# Patient Record
Sex: Male | Born: 1940 | Race: Black or African American | Hispanic: No | Marital: Single | Smoking: Former smoker
Health system: Southern US, Community
[De-identification: ages and names within clinical notes are randomized; demographics above are authoritative.]

## PROBLEM LIST (undated history)

## (undated) DIAGNOSIS — I10 Essential (primary) hypertension: Secondary | ICD-10-CM

## (undated) DIAGNOSIS — H409 Unspecified glaucoma: Secondary | ICD-10-CM

## (undated) DIAGNOSIS — C3490 Malignant neoplasm of unspecified part of unspecified bronchus or lung: Secondary | ICD-10-CM

## (undated) DIAGNOSIS — E785 Hyperlipidemia, unspecified: Secondary | ICD-10-CM

## (undated) HISTORY — DX: Unspecified glaucoma: H40.9

## (undated) HISTORY — PX: EYE SURGERY: SHX253

## (undated) HISTORY — DX: Essential (primary) hypertension: I10

## (undated) HISTORY — DX: Hyperlipidemia, unspecified: E78.5

---

## 2013-02-24 ENCOUNTER — Encounter (INDEPENDENT_AMBULATORY_CARE_PROVIDER_SITE_OTHER): Payer: PRIVATE HEALTH INSURANCE | Admitting: Ophthalmology

## 2013-02-24 DIAGNOSIS — H2510 Age-related nuclear cataract, unspecified eye: Secondary | ICD-10-CM

## 2013-02-24 DIAGNOSIS — H35039 Hypertensive retinopathy, unspecified eye: Secondary | ICD-10-CM

## 2013-02-24 DIAGNOSIS — H43819 Vitreous degeneration, unspecified eye: Secondary | ICD-10-CM

## 2013-02-24 DIAGNOSIS — H348192 Central retinal vein occlusion, unspecified eye, stable: Secondary | ICD-10-CM

## 2013-02-24 DIAGNOSIS — I10 Essential (primary) hypertension: Secondary | ICD-10-CM

## 2013-03-18 ENCOUNTER — Encounter (INDEPENDENT_AMBULATORY_CARE_PROVIDER_SITE_OTHER): Payer: PRIVATE HEALTH INSURANCE | Admitting: Ophthalmology

## 2013-03-18 DIAGNOSIS — H2510 Age-related nuclear cataract, unspecified eye: Secondary | ICD-10-CM

## 2013-03-18 DIAGNOSIS — I10 Essential (primary) hypertension: Secondary | ICD-10-CM

## 2013-03-18 DIAGNOSIS — H348192 Central retinal vein occlusion, unspecified eye, stable: Secondary | ICD-10-CM

## 2013-03-18 DIAGNOSIS — H35039 Hypertensive retinopathy, unspecified eye: Secondary | ICD-10-CM

## 2013-03-18 DIAGNOSIS — H4310 Vitreous hemorrhage, unspecified eye: Secondary | ICD-10-CM

## 2013-04-20 ENCOUNTER — Encounter (INDEPENDENT_AMBULATORY_CARE_PROVIDER_SITE_OTHER): Payer: PRIVATE HEALTH INSURANCE | Admitting: Ophthalmology

## 2013-04-20 DIAGNOSIS — H35039 Hypertensive retinopathy, unspecified eye: Secondary | ICD-10-CM

## 2013-04-20 DIAGNOSIS — H4310 Vitreous hemorrhage, unspecified eye: Secondary | ICD-10-CM

## 2013-04-20 DIAGNOSIS — I10 Essential (primary) hypertension: Secondary | ICD-10-CM

## 2013-04-20 DIAGNOSIS — H348192 Central retinal vein occlusion, unspecified eye, stable: Secondary | ICD-10-CM

## 2013-08-26 ENCOUNTER — Ambulatory Visit (INDEPENDENT_AMBULATORY_CARE_PROVIDER_SITE_OTHER): Payer: PRIVATE HEALTH INSURANCE | Admitting: Ophthalmology

## 2013-08-26 DIAGNOSIS — H5030 Unspecified intermittent heterotropia: Secondary | ICD-10-CM

## 2013-08-26 DIAGNOSIS — H35039 Hypertensive retinopathy, unspecified eye: Secondary | ICD-10-CM

## 2013-08-26 DIAGNOSIS — H348192 Central retinal vein occlusion, unspecified eye, stable: Secondary | ICD-10-CM

## 2013-08-26 DIAGNOSIS — H43819 Vitreous degeneration, unspecified eye: Secondary | ICD-10-CM

## 2013-08-26 DIAGNOSIS — H2510 Age-related nuclear cataract, unspecified eye: Secondary | ICD-10-CM

## 2013-08-26 DIAGNOSIS — I10 Essential (primary) hypertension: Secondary | ICD-10-CM

## 2013-08-27 ENCOUNTER — Ambulatory Visit: Payer: Self-pay | Admitting: Internal Medicine

## 2014-03-08 ENCOUNTER — Ambulatory Visit (INDEPENDENT_AMBULATORY_CARE_PROVIDER_SITE_OTHER): Payer: PRIVATE HEALTH INSURANCE | Admitting: Ophthalmology

## 2014-03-08 DIAGNOSIS — I10 Essential (primary) hypertension: Secondary | ICD-10-CM

## 2014-03-08 DIAGNOSIS — H43819 Vitreous degeneration, unspecified eye: Secondary | ICD-10-CM

## 2014-03-08 DIAGNOSIS — H348192 Central retinal vein occlusion, unspecified eye, stable: Secondary | ICD-10-CM

## 2014-03-08 DIAGNOSIS — H2510 Age-related nuclear cataract, unspecified eye: Secondary | ICD-10-CM

## 2014-03-08 DIAGNOSIS — H35039 Hypertensive retinopathy, unspecified eye: Secondary | ICD-10-CM

## 2014-06-23 DIAGNOSIS — Z720 Tobacco use: Secondary | ICD-10-CM | POA: Insufficient documentation

## 2014-12-29 DIAGNOSIS — I10 Essential (primary) hypertension: Secondary | ICD-10-CM | POA: Diagnosis not present

## 2014-12-29 DIAGNOSIS — E780 Pure hypercholesterolemia: Secondary | ICD-10-CM | POA: Diagnosis not present

## 2015-03-02 DIAGNOSIS — H43811 Vitreous degeneration, right eye: Secondary | ICD-10-CM | POA: Diagnosis not present

## 2015-03-02 DIAGNOSIS — H4089 Other specified glaucoma: Secondary | ICD-10-CM | POA: Diagnosis not present

## 2015-06-21 DIAGNOSIS — I10 Essential (primary) hypertension: Secondary | ICD-10-CM | POA: Diagnosis not present

## 2015-06-21 DIAGNOSIS — I6529 Occlusion and stenosis of unspecified carotid artery: Secondary | ICD-10-CM | POA: Diagnosis not present

## 2015-06-21 DIAGNOSIS — I6523 Occlusion and stenosis of bilateral carotid arteries: Secondary | ICD-10-CM | POA: Diagnosis not present

## 2015-06-30 DIAGNOSIS — I10 Essential (primary) hypertension: Secondary | ICD-10-CM | POA: Diagnosis not present

## 2015-06-30 DIAGNOSIS — E780 Pure hypercholesterolemia: Secondary | ICD-10-CM | POA: Diagnosis not present

## 2015-07-07 DIAGNOSIS — E780 Pure hypercholesterolemia: Secondary | ICD-10-CM | POA: Diagnosis not present

## 2015-07-07 DIAGNOSIS — E871 Hypo-osmolality and hyponatremia: Secondary | ICD-10-CM | POA: Diagnosis not present

## 2015-07-07 DIAGNOSIS — I10 Essential (primary) hypertension: Secondary | ICD-10-CM | POA: Diagnosis not present

## 2015-07-07 DIAGNOSIS — Z0001 Encounter for general adult medical examination with abnormal findings: Secondary | ICD-10-CM | POA: Diagnosis not present

## 2015-07-07 DIAGNOSIS — Z720 Tobacco use: Secondary | ICD-10-CM | POA: Diagnosis not present

## 2015-07-20 DIAGNOSIS — H43819 Vitreous degeneration, unspecified eye: Secondary | ICD-10-CM | POA: Diagnosis not present

## 2015-07-20 DIAGNOSIS — H43811 Vitreous degeneration, right eye: Secondary | ICD-10-CM | POA: Diagnosis not present

## 2015-07-20 DIAGNOSIS — H4089 Other specified glaucoma: Secondary | ICD-10-CM | POA: Diagnosis not present

## 2015-11-16 DIAGNOSIS — H348111 Central retinal vein occlusion, right eye, with retinal neovascularization: Secondary | ICD-10-CM | POA: Diagnosis not present

## 2015-11-16 DIAGNOSIS — H43813 Vitreous degeneration, bilateral: Secondary | ICD-10-CM | POA: Diagnosis not present

## 2015-11-16 DIAGNOSIS — H4089 Other specified glaucoma: Secondary | ICD-10-CM | POA: Diagnosis not present

## 2015-12-27 DIAGNOSIS — I10 Essential (primary) hypertension: Secondary | ICD-10-CM | POA: Diagnosis not present

## 2015-12-27 DIAGNOSIS — I6523 Occlusion and stenosis of bilateral carotid arteries: Secondary | ICD-10-CM | POA: Diagnosis not present

## 2015-12-27 DIAGNOSIS — I6529 Occlusion and stenosis of unspecified carotid artery: Secondary | ICD-10-CM | POA: Diagnosis not present

## 2015-12-29 DIAGNOSIS — I10 Essential (primary) hypertension: Secondary | ICD-10-CM | POA: Diagnosis not present

## 2016-01-04 DIAGNOSIS — Z125 Encounter for screening for malignant neoplasm of prostate: Secondary | ICD-10-CM | POA: Diagnosis not present

## 2016-01-04 DIAGNOSIS — E7800 Pure hypercholesterolemia, unspecified: Secondary | ICD-10-CM | POA: Diagnosis not present

## 2016-01-04 DIAGNOSIS — E871 Hypo-osmolality and hyponatremia: Secondary | ICD-10-CM | POA: Diagnosis not present

## 2016-01-04 DIAGNOSIS — Z720 Tobacco use: Secondary | ICD-10-CM | POA: Diagnosis not present

## 2016-01-04 DIAGNOSIS — I10 Essential (primary) hypertension: Secondary | ICD-10-CM | POA: Diagnosis not present

## 2016-02-15 DIAGNOSIS — H43819 Vitreous degeneration, unspecified eye: Secondary | ICD-10-CM | POA: Diagnosis not present

## 2016-02-15 DIAGNOSIS — H4089 Other specified glaucoma: Secondary | ICD-10-CM | POA: Diagnosis not present

## 2016-02-15 DIAGNOSIS — H348111 Central retinal vein occlusion, right eye, with retinal neovascularization: Secondary | ICD-10-CM | POA: Diagnosis not present

## 2016-06-26 DIAGNOSIS — I6529 Occlusion and stenosis of unspecified carotid artery: Secondary | ICD-10-CM | POA: Diagnosis not present

## 2016-06-26 DIAGNOSIS — I6523 Occlusion and stenosis of bilateral carotid arteries: Secondary | ICD-10-CM | POA: Diagnosis not present

## 2016-06-26 DIAGNOSIS — I10 Essential (primary) hypertension: Secondary | ICD-10-CM | POA: Diagnosis not present

## 2016-07-04 DIAGNOSIS — Z125 Encounter for screening for malignant neoplasm of prostate: Secondary | ICD-10-CM | POA: Diagnosis not present

## 2016-07-04 DIAGNOSIS — I10 Essential (primary) hypertension: Secondary | ICD-10-CM | POA: Diagnosis not present

## 2016-07-11 DIAGNOSIS — Z720 Tobacco use: Secondary | ICD-10-CM | POA: Diagnosis not present

## 2016-07-11 DIAGNOSIS — Z0001 Encounter for general adult medical examination with abnormal findings: Secondary | ICD-10-CM | POA: Diagnosis not present

## 2016-07-11 DIAGNOSIS — E7800 Pure hypercholesterolemia, unspecified: Secondary | ICD-10-CM | POA: Diagnosis not present

## 2016-07-11 DIAGNOSIS — I10 Essential (primary) hypertension: Secondary | ICD-10-CM | POA: Diagnosis not present

## 2016-09-05 DIAGNOSIS — H348111 Central retinal vein occlusion, right eye, with retinal neovascularization: Secondary | ICD-10-CM | POA: Diagnosis not present

## 2016-09-05 DIAGNOSIS — H4089 Other specified glaucoma: Secondary | ICD-10-CM | POA: Diagnosis not present

## 2016-09-05 DIAGNOSIS — H43819 Vitreous degeneration, unspecified eye: Secondary | ICD-10-CM | POA: Diagnosis not present

## 2017-01-09 DIAGNOSIS — I10 Essential (primary) hypertension: Secondary | ICD-10-CM | POA: Diagnosis not present

## 2017-01-09 DIAGNOSIS — Z125 Encounter for screening for malignant neoplasm of prostate: Secondary | ICD-10-CM | POA: Diagnosis not present

## 2017-01-09 DIAGNOSIS — Z720 Tobacco use: Secondary | ICD-10-CM | POA: Diagnosis not present

## 2017-01-09 DIAGNOSIS — Z0000 Encounter for general adult medical examination without abnormal findings: Secondary | ICD-10-CM | POA: Diagnosis not present

## 2017-02-27 DIAGNOSIS — H4089 Other specified glaucoma: Secondary | ICD-10-CM | POA: Diagnosis not present

## 2017-02-27 DIAGNOSIS — H348111 Central retinal vein occlusion, right eye, with retinal neovascularization: Secondary | ICD-10-CM | POA: Diagnosis not present

## 2017-02-27 DIAGNOSIS — H2512 Age-related nuclear cataract, left eye: Secondary | ICD-10-CM | POA: Diagnosis not present

## 2017-06-12 DIAGNOSIS — H18231 Secondary corneal edema, right eye: Secondary | ICD-10-CM | POA: Diagnosis not present

## 2017-06-14 DIAGNOSIS — T8131XA Disruption of external operation (surgical) wound, not elsewhere classified, initial encounter: Secondary | ICD-10-CM | POA: Diagnosis not present

## 2017-06-14 DIAGNOSIS — S0501XA Injury of conjunctiva and corneal abrasion without foreign body, right eye, initial encounter: Secondary | ICD-10-CM | POA: Diagnosis not present

## 2017-06-14 DIAGNOSIS — H44411 Flat anterior chamber hypotony of right eye: Secondary | ICD-10-CM | POA: Diagnosis not present

## 2017-06-14 DIAGNOSIS — T85698A Other mechanical complication of other specified internal prosthetic devices, implants and grafts, initial encounter: Secondary | ICD-10-CM | POA: Diagnosis not present

## 2017-06-19 DIAGNOSIS — H18231 Secondary corneal edema, right eye: Secondary | ICD-10-CM | POA: Diagnosis not present

## 2017-06-19 DIAGNOSIS — H4089 Other specified glaucoma: Secondary | ICD-10-CM | POA: Diagnosis not present

## 2017-06-19 DIAGNOSIS — H2512 Age-related nuclear cataract, left eye: Secondary | ICD-10-CM | POA: Diagnosis not present

## 2017-06-26 DIAGNOSIS — H4089 Other specified glaucoma: Secondary | ICD-10-CM | POA: Diagnosis not present

## 2017-06-26 DIAGNOSIS — H18231 Secondary corneal edema, right eye: Secondary | ICD-10-CM | POA: Diagnosis not present

## 2017-06-28 ENCOUNTER — Other Ambulatory Visit (INDEPENDENT_AMBULATORY_CARE_PROVIDER_SITE_OTHER): Payer: Self-pay | Admitting: Vascular Surgery

## 2017-06-28 DIAGNOSIS — I6523 Occlusion and stenosis of bilateral carotid arteries: Secondary | ICD-10-CM

## 2017-07-02 ENCOUNTER — Ambulatory Visit (INDEPENDENT_AMBULATORY_CARE_PROVIDER_SITE_OTHER): Payer: Medicare HMO

## 2017-07-02 ENCOUNTER — Encounter (INDEPENDENT_AMBULATORY_CARE_PROVIDER_SITE_OTHER): Payer: Self-pay | Admitting: Vascular Surgery

## 2017-07-02 ENCOUNTER — Ambulatory Visit (INDEPENDENT_AMBULATORY_CARE_PROVIDER_SITE_OTHER): Payer: Medicare HMO | Admitting: Vascular Surgery

## 2017-07-02 VITALS — BP 152/85 | HR 66 | Resp 19 | Ht 68.0 in | Wt 112.0 lb

## 2017-07-02 DIAGNOSIS — I6523 Occlusion and stenosis of bilateral carotid arteries: Secondary | ICD-10-CM | POA: Diagnosis not present

## 2017-07-02 DIAGNOSIS — E785 Hyperlipidemia, unspecified: Secondary | ICD-10-CM | POA: Diagnosis not present

## 2017-07-02 DIAGNOSIS — I6529 Occlusion and stenosis of unspecified carotid artery: Secondary | ICD-10-CM | POA: Insufficient documentation

## 2017-07-02 DIAGNOSIS — I10 Essential (primary) hypertension: Secondary | ICD-10-CM

## 2017-07-02 NOTE — Assessment & Plan Note (Signed)
blood pressure control important in reducing the progression of atherosclerotic disease. On appropriate oral medications.  

## 2017-07-02 NOTE — Assessment & Plan Note (Signed)
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 He has a known right carotid artery occlusion. No worrisome symptoms. Continue aspirin and statin agent. Recheck in 1 year.

## 2017-07-02 NOTE — Progress Notes (Signed)
MRN : [id number]  Cody Williams is a 76 y.o. ([birth year]) male who presents with chief complaint of  Chief Complaint  Patient presents with  . Carotid    1 year carotid f/u  .  History of Present Illness: Patient returns in follow-up of his carotid disease. He is doing well without specific complaints today. He denies focal neurologic symptoms. Specifically, the patient denies amaurosis fugax, speech or swallowing difficulties, or arm or leg weakness or numbness His carotid duplex today demonstrates stable 1-39% left ICA stenosis. He has a known right carotid artery occlusion.  Current Outpatient Prescriptions  Medication Sig Dispense Refill  . amLODipine (NORVASC) 5 MG tablet take 1 tablet by mouth once daily    . aspirin EC 81 MG tablet Take 81 mg by mouth daily.    Marland Kitchen atorvastatin (LIPITOR) 10 MG tablet take 1 tablet by mouth once daily    . ofloxacin (OCUFLOX) 0.3 % ophthalmic solution instill 1 drop into both eyes four times a day  0  . timolol (TIMOPTIC) 0.25 % ophthalmic solution   0   No current facility-administered medications for this visit.     Past Medical History:  Diagnosis Date  . Glaucoma   . Hyperlipidemia   . Hypertension     Past Surgical History:  Procedure Laterality Date  . EYE SURGERY      Social History Social History  Substance Use Topics  . Smoking status: Current Every Day Smoker  . Smokeless tobacco: Never Used  . Alcohol use Yes      Family History No bleeding or clotting disorders  No Known Allergies   REVIEW OF SYSTEMS (Negative unless checked)  Constitutional: [] Weight loss  [] Fever  [] Chills Cardiac: [] Chest pain   [] Chest pressure   [] Palpitations   [] Shortness of breath when laying flat   [] Shortness of breath at rest   [x] Shortness of breath with exertion. Vascular:  [] Pain in legs with walking   [] Pain in legs at rest   [] Pain in legs when laying flat   [] Claudication   [] Pain in feet when walking  [] Pain in feet at  rest  [] Pain in feet when laying flat   [] History of DVT   [] Phlebitis   [] Swelling in legs   [] Varicose veins   [] Non-healing ulcers Pulmonary:   [] Uses home oxygen   [] Productive cough   [] Hemoptysis   [] Wheeze  [] COPD   [] Asthma Neurologic:  [] Dizziness  [] Blackouts   [] Seizures   [] History of stroke   [] History of TIA  [] Aphasia   [] Temporary blindness   [] Dysphagia   [] Weakness or numbness in arms   [] Weakness or numbness in legs Musculoskeletal:  [x] Arthritis   [] Joint swelling   [] Joint pain   [] Low back pain Hematologic:  [] Easy bruising  [] Easy bleeding   [] Hypercoagulable state   [] Anemic  [] Hepatitis Gastrointestinal:  [] Blood in stool   [] Vomiting blood  [] Gastroesophageal reflux/heartburn   [] Difficulty swallowing. Genitourinary:  [] Chronic kidney disease   [] Difficult urination  [] Frequent urination  [] Burning with urination   [] Blood in urine Skin:  [] Rashes   [] Ulcers   [] Wounds Psychological:  [] History of anxiety   []  History of major depression.  Physical Examination  Vitals:   07/02/17 1015 07/02/17 1016  BP: (!) 190/89 (!) 152/85  Pulse: 66   Resp: 19   Weight: 50.8 kg (112 lb)   Height: 5\' 8"  (1.727 m)    Body mass index is 17.03 kg/m. Gen:  WD/WN, NAD Head:  [place]/AT, + temporalis wasting. Ear/Nose/Throat: Hearing grossly intact, nares w/o erythema or drainage, trachea midline Eyes: Conjunctiva clear. Sclera non-icteric Neck: Supple.  No JVD. Right carotid bruit Pulmonary:  Good air movement, equal and clear to auscultation bilaterally.  Cardiac: RRR, normal S1, S2 Vascular:  Vessel Right Left  Radial Palpable Palpable                                    Musculoskeletal: M/S 5/5 throughout.  No deformity or atrophy.  Neurologic: CN 2-12 intact. Sensation grossly intact in extremities.  Symmetrical.  Speech is fluent. Motor exam as listed above. Psychiatric: Judgment intact, Mood & affect appropriate for pt's clinical situation. Dermatologic: No rashes or  ulcers noted.  No cellulitis or open wounds.      CBC No results found for: WBC, HGB, HCT, MCV, PLT  BMET No results found for: NA, K, CL, CO2, GLUCOSE, BUN, CREATININE, CALCIUM, GFRNONAA, GFRAA CrCl cannot be calculated (No order found.).  COAG No results found for: INR, PROTIME  Radiology No results found.    Assessment/Plan Hypertension blood pressure control important in reducing the progression of atherosclerotic disease. On appropriate oral medications.   Hyperlipidemia lipid control important in reducing the progression of atherosclerotic disease. 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 He has a known right carotid artery occlusion. No worrisome symptoms. Continue aspirin and statin agent. Recheck in 1 year.    Leotis Pain, MD  07/02/2017 12:07 PM    This note was created with Dragon medical transcription system.  Any errors from dictation are purely unintentional

## 2017-07-02 NOTE — Assessment & Plan Note (Signed)
lipid control important in reducing the progression of atherosclerotic disease. Continue statin therapy  

## 2017-07-09 DIAGNOSIS — H18231 Secondary corneal edema, right eye: Secondary | ICD-10-CM | POA: Diagnosis not present

## 2017-07-09 DIAGNOSIS — H4089 Other specified glaucoma: Secondary | ICD-10-CM | POA: Diagnosis not present

## 2017-07-10 DIAGNOSIS — Z125 Encounter for screening for malignant neoplasm of prostate: Secondary | ICD-10-CM | POA: Diagnosis not present

## 2017-07-10 DIAGNOSIS — I10 Essential (primary) hypertension: Secondary | ICD-10-CM | POA: Diagnosis not present

## 2017-07-17 DIAGNOSIS — I6523 Occlusion and stenosis of bilateral carotid arteries: Secondary | ICD-10-CM | POA: Diagnosis not present

## 2017-07-17 DIAGNOSIS — Z0001 Encounter for general adult medical examination with abnormal findings: Secondary | ICD-10-CM | POA: Diagnosis not present

## 2017-07-17 DIAGNOSIS — I10 Essential (primary) hypertension: Secondary | ICD-10-CM | POA: Diagnosis not present

## 2017-07-17 DIAGNOSIS — E7800 Pure hypercholesterolemia, unspecified: Secondary | ICD-10-CM | POA: Diagnosis not present

## 2017-07-24 DIAGNOSIS — H18231 Secondary corneal edema, right eye: Secondary | ICD-10-CM | POA: Diagnosis not present

## 2017-07-24 DIAGNOSIS — H4089 Other specified glaucoma: Secondary | ICD-10-CM | POA: Diagnosis not present

## 2017-08-14 DIAGNOSIS — H18231 Secondary corneal edema, right eye: Secondary | ICD-10-CM | POA: Diagnosis not present

## 2017-08-14 DIAGNOSIS — H4089 Other specified glaucoma: Secondary | ICD-10-CM | POA: Diagnosis not present

## 2017-09-27 DIAGNOSIS — H18231 Secondary corneal edema, right eye: Secondary | ICD-10-CM | POA: Diagnosis not present

## 2017-09-27 DIAGNOSIS — H4089 Other specified glaucoma: Secondary | ICD-10-CM | POA: Diagnosis not present

## 2017-11-27 DIAGNOSIS — H4089 Other specified glaucoma: Secondary | ICD-10-CM | POA: Diagnosis not present

## 2017-11-27 DIAGNOSIS — H18231 Secondary corneal edema, right eye: Secondary | ICD-10-CM | POA: Diagnosis not present

## 2018-01-08 DIAGNOSIS — I10 Essential (primary) hypertension: Secondary | ICD-10-CM | POA: Diagnosis not present

## 2018-01-08 DIAGNOSIS — E7800 Pure hypercholesterolemia, unspecified: Secondary | ICD-10-CM | POA: Diagnosis not present

## 2018-01-15 DIAGNOSIS — I6523 Occlusion and stenosis of bilateral carotid arteries: Secondary | ICD-10-CM | POA: Diagnosis not present

## 2018-01-15 DIAGNOSIS — Z720 Tobacco use: Secondary | ICD-10-CM | POA: Diagnosis not present

## 2018-01-15 DIAGNOSIS — Z0000 Encounter for general adult medical examination without abnormal findings: Secondary | ICD-10-CM | POA: Diagnosis not present

## 2018-01-15 DIAGNOSIS — E7800 Pure hypercholesterolemia, unspecified: Secondary | ICD-10-CM | POA: Diagnosis not present

## 2018-01-15 DIAGNOSIS — Z125 Encounter for screening for malignant neoplasm of prostate: Secondary | ICD-10-CM | POA: Diagnosis not present

## 2018-01-15 DIAGNOSIS — I10 Essential (primary) hypertension: Secondary | ICD-10-CM | POA: Diagnosis not present

## 2018-01-23 DIAGNOSIS — H4089 Other specified glaucoma: Secondary | ICD-10-CM | POA: Diagnosis not present

## 2018-01-23 DIAGNOSIS — H18231 Secondary corneal edema, right eye: Secondary | ICD-10-CM | POA: Diagnosis not present

## 2018-05-22 DIAGNOSIS — H18231 Secondary corneal edema, right eye: Secondary | ICD-10-CM | POA: Diagnosis not present

## 2018-05-22 DIAGNOSIS — H4089 Other specified glaucoma: Secondary | ICD-10-CM | POA: Diagnosis not present

## 2018-07-04 ENCOUNTER — Ambulatory Visit (INDEPENDENT_AMBULATORY_CARE_PROVIDER_SITE_OTHER): Payer: Medicare HMO | Admitting: Vascular Surgery

## 2018-07-04 ENCOUNTER — Encounter (INDEPENDENT_AMBULATORY_CARE_PROVIDER_SITE_OTHER): Payer: Self-pay | Admitting: Vascular Surgery

## 2018-07-04 ENCOUNTER — Encounter (INDEPENDENT_AMBULATORY_CARE_PROVIDER_SITE_OTHER): Payer: Self-pay

## 2018-07-04 ENCOUNTER — Ambulatory Visit (INDEPENDENT_AMBULATORY_CARE_PROVIDER_SITE_OTHER): Payer: Medicare HMO

## 2018-07-04 VITALS — BP 182/82 | HR 66 | Resp 16 | Ht 68.0 in | Wt 115.4 lb

## 2018-07-04 DIAGNOSIS — I6523 Occlusion and stenosis of bilateral carotid arteries: Secondary | ICD-10-CM | POA: Diagnosis not present

## 2018-07-04 DIAGNOSIS — I10 Essential (primary) hypertension: Secondary | ICD-10-CM | POA: Diagnosis not present

## 2018-07-04 DIAGNOSIS — E785 Hyperlipidemia, unspecified: Secondary | ICD-10-CM | POA: Diagnosis not present

## 2018-07-04 NOTE — Assessment & Plan Note (Signed)
Carotid duplex today shows his known right ICA occlusion and stable 1 to 39% left ICA stenosis. No role for intervention.  Continue aspirin and statin agent.  Recheck in 1 year

## 2018-07-04 NOTE — Progress Notes (Signed)
MRN : [id number]  Cody Williams is a 77 y.o. (05/29/1941) male who presents with chief complaint of  Chief Complaint  Patient presents with  . Carotid    12yr follow up  .  History of Present Illness: Patient returns in follow-up of his carotid disease.  He is doing well today and has no specific complaints.  He denies focal neurologic symptoms since his last visit. Specifically, the patient denies amaurosis fugax, speech or swallowing difficulties, or arm or leg weakness or numbness Carotid duplex today shows his known right ICA occlusion and stable 1 to 39% left ICA stenosis.  Current Outpatient Medications  Medication Sig Dispense Refill  . amLODipine (NORVASC) 5 MG tablet take 1 tablet by mouth once daily    . aspirin EC 81 MG tablet Take 81 mg by mouth daily.    Marland Kitchen atorvastatin (LIPITOR) 10 MG tablet take 1 tablet by mouth once daily    . ofloxacin (OCUFLOX) 0.3 % ophthalmic solution instill 1 drop into both eyes four times a day  0  . timolol (TIMOPTIC) 0.25 % ophthalmic solution   0   No current facility-administered medications for this visit.     Past Medical History:  Diagnosis Date  . Glaucoma   . Hyperlipidemia   . Hypertension    Past Surgical History Eye surgery  Social History  Substance Use Topics  . Smoking status: Current Every Day Smoker  . Smokeless tobacco: Never Used  . Alcohol use Yes      Family History No bleeding or clotting disorders  No Known Allergies   REVIEW OF SYSTEMS (Negative unless checked)  Constitutional: [] Weight loss  [] Fever  [] Chills Cardiac: [] Chest pain   [] Chest pressure   [] Palpitations   [] Shortness of breath when laying flat   [] Shortness of breath at rest   [x] Shortness of breath with exertion. Vascular:  [] Pain in legs with walking   [] Pain in legs at rest   [] Pain in legs when laying flat   [] Claudication   [] Pain in feet when walking  [] Pain in feet at rest  [] Pain in feet when laying flat   [] History of  DVT   [] Phlebitis   [] Swelling in legs   [] Varicose veins   [] Non-healing ulcers Pulmonary:   [] Uses home oxygen   [] Productive cough   [] Hemoptysis   [] Wheeze  [] COPD   [] Asthma Neurologic:  [] Dizziness  [] Blackouts   [] Seizures   [] History of stroke   [] History of TIA  [] Aphasia   [] Temporary blindness   [] Dysphagia   [] Weakness or numbness in arms   [] Weakness or numbness in legs Musculoskeletal:  [x] Arthritis   [] Joint swelling   [] Joint pain   [] Low back pain Hematologic:  [] Easy bruising  [] Easy bleeding   [] Hypercoagulable state   [] Anemic  [] Hepatitis Gastrointestinal:  [] Blood in stool   [] Vomiting blood  [] Gastroesophageal reflux/heartburn   [] Difficulty swallowing. Genitourinary:  [] Chronic kidney disease   [] Difficult urination  [] Frequent urination  [] Burning with urination   [] Blood in urine Skin:  [] Rashes   [] Ulcers   [] Wounds Psychological:  [] History of anxiety   []  History of major depression.    Physical Examination  Vitals:   07/04/18 1104 07/04/18 1105  BP: (!) 181/85 (!) 182/82  Pulse: 66   Resp: 16   Weight: 115 lb 6.4 oz (52.3 kg)   Height: 5\' 8"  (1.727 m)    Body mass index is 17.55 kg/m. Gen:  WD/WN, NAD Head: [place]/AT, + temporalis  wasting. Ear/Nose/Throat: Hearing grossly intact, nares w/o erythema or drainage, trachea midline Eyes: Conjunctiva clear. Sclera non-icteric Neck: Supple.  Right carotid bruit  Pulmonary:  Good air movement, equal and clear to auscultation bilaterally.  Cardiac: RRR, No JVD Vascular:  Vessel Right Left  Radial Palpable Palpable                                   Musculoskeletal: M/S 5/5 throughout.  No deformity or atrophy.  Neurologic: CN 2-12 intact. Sensation grossly intact in extremities.  Symmetrical.  Speech is fluent. Motor exam as listed above. Psychiatric: Judgment intact, Mood & affect appropriate for pt's clinical situation. Dermatologic: No rashes or ulcers noted.  No cellulitis or open  wounds.      CBC No results found for: WBC, HGB, HCT, MCV, PLT  BMET No results found for: NA, K, CL, CO2, GLUCOSE, BUN, CREATININE, CALCIUM, GFRNONAA, GFRAA CrCl cannot be calculated (No successful lab value found.).  COAG No results found for: INR, PROTIME  Radiology No results found.    Assessment/Plan Hypertension blood pressure control important in reducing the progression of atherosclerotic disease. On appropriate oral medications.   Hyperlipidemia lipid control important in reducing the progression of atherosclerotic disease. Continue statin therapy  Carotid stenosis Carotid duplex today shows his known right ICA occlusion and stable 1 to 39% left ICA stenosis. No role for intervention.  Continue aspirin and statin agent.  Recheck in 1 year    Leotis Pain, MD  07/04/2018 1:15 PM    This note was created with Dragon medical transcription system.  Any errors from dictation are purely unintentional

## 2018-07-16 DIAGNOSIS — Z125 Encounter for screening for malignant neoplasm of prostate: Secondary | ICD-10-CM | POA: Diagnosis not present

## 2018-07-16 DIAGNOSIS — I10 Essential (primary) hypertension: Secondary | ICD-10-CM | POA: Diagnosis not present

## 2018-07-23 DIAGNOSIS — E7800 Pure hypercholesterolemia, unspecified: Secondary | ICD-10-CM | POA: Diagnosis not present

## 2018-07-23 DIAGNOSIS — I6523 Occlusion and stenosis of bilateral carotid arteries: Secondary | ICD-10-CM | POA: Diagnosis not present

## 2018-07-23 DIAGNOSIS — Z0001 Encounter for general adult medical examination with abnormal findings: Secondary | ICD-10-CM | POA: Diagnosis not present

## 2018-07-23 DIAGNOSIS — Z720 Tobacco use: Secondary | ICD-10-CM | POA: Diagnosis not present

## 2018-07-23 DIAGNOSIS — I10 Essential (primary) hypertension: Secondary | ICD-10-CM | POA: Diagnosis not present

## 2018-11-20 DIAGNOSIS — H4089 Other specified glaucoma: Secondary | ICD-10-CM | POA: Diagnosis not present

## 2018-11-20 DIAGNOSIS — H18231 Secondary corneal edema, right eye: Secondary | ICD-10-CM | POA: Diagnosis not present

## 2019-01-14 DIAGNOSIS — E7800 Pure hypercholesterolemia, unspecified: Secondary | ICD-10-CM | POA: Diagnosis not present

## 2019-01-14 DIAGNOSIS — I10 Essential (primary) hypertension: Secondary | ICD-10-CM | POA: Diagnosis not present

## 2019-01-21 DIAGNOSIS — Z720 Tobacco use: Secondary | ICD-10-CM | POA: Diagnosis not present

## 2019-01-21 DIAGNOSIS — E7800 Pure hypercholesterolemia, unspecified: Secondary | ICD-10-CM | POA: Diagnosis not present

## 2019-01-21 DIAGNOSIS — I6523 Occlusion and stenosis of bilateral carotid arteries: Secondary | ICD-10-CM | POA: Diagnosis not present

## 2019-01-21 DIAGNOSIS — I10 Essential (primary) hypertension: Secondary | ICD-10-CM | POA: Diagnosis not present

## 2019-01-21 DIAGNOSIS — Z0000 Encounter for general adult medical examination without abnormal findings: Secondary | ICD-10-CM | POA: Diagnosis not present

## 2019-04-23 DIAGNOSIS — H4089 Other specified glaucoma: Secondary | ICD-10-CM | POA: Diagnosis not present

## 2019-04-23 DIAGNOSIS — H18231 Secondary corneal edema, right eye: Secondary | ICD-10-CM | POA: Diagnosis not present

## 2019-07-22 DIAGNOSIS — I10 Essential (primary) hypertension: Secondary | ICD-10-CM | POA: Diagnosis not present

## 2019-07-29 DIAGNOSIS — I10 Essential (primary) hypertension: Secondary | ICD-10-CM | POA: Diagnosis not present

## 2019-07-29 DIAGNOSIS — Z79899 Other long term (current) drug therapy: Secondary | ICD-10-CM | POA: Diagnosis not present

## 2019-07-29 DIAGNOSIS — I6523 Occlusion and stenosis of bilateral carotid arteries: Secondary | ICD-10-CM | POA: Diagnosis not present

## 2019-07-29 DIAGNOSIS — Z0001 Encounter for general adult medical examination with abnormal findings: Secondary | ICD-10-CM | POA: Diagnosis not present

## 2019-07-29 DIAGNOSIS — Z87891 Personal history of nicotine dependence: Secondary | ICD-10-CM | POA: Diagnosis not present

## 2019-07-29 DIAGNOSIS — E785 Hyperlipidemia, unspecified: Secondary | ICD-10-CM | POA: Diagnosis not present

## 2019-08-04 ENCOUNTER — Ambulatory Visit (INDEPENDENT_AMBULATORY_CARE_PROVIDER_SITE_OTHER): Payer: Medicare HMO

## 2019-08-04 ENCOUNTER — Other Ambulatory Visit: Payer: Self-pay

## 2019-08-04 ENCOUNTER — Encounter (INDEPENDENT_AMBULATORY_CARE_PROVIDER_SITE_OTHER): Payer: Self-pay | Admitting: Vascular Surgery

## 2019-08-04 ENCOUNTER — Ambulatory Visit (INDEPENDENT_AMBULATORY_CARE_PROVIDER_SITE_OTHER): Payer: Medicare HMO | Admitting: Vascular Surgery

## 2019-08-04 VITALS — BP 155/76 | HR 67 | Resp 16 | Wt 129.0 lb

## 2019-08-04 DIAGNOSIS — I10 Essential (primary) hypertension: Secondary | ICD-10-CM | POA: Diagnosis not present

## 2019-08-04 DIAGNOSIS — E785 Hyperlipidemia, unspecified: Secondary | ICD-10-CM | POA: Diagnosis not present

## 2019-08-04 DIAGNOSIS — I6523 Occlusion and stenosis of bilateral carotid arteries: Secondary | ICD-10-CM

## 2019-08-04 NOTE — Progress Notes (Signed)
MRN : [id number]  Cody Williams is a 78 y.o. (1941/03/23) male who presents with chief complaint of  Chief Complaint  Patient presents with  . Follow-up    ultrasound follow up  .  History of Present Illness: Patient returns in follow-up of his carotid disease.  He is doing well.  He has no complaints today.  Carotid duplex today shows a known right ICA occlusion and stable 1 to 39% left ICA stenosis.  Current Outpatient Medications  Medication Sig Dispense Refill  . amLODipine (NORVASC) 5 MG tablet take 1 tablet by mouth once daily    . aspirin EC 81 MG tablet Take 81 mg by mouth daily.    Marland Kitchen atorvastatin (LIPITOR) 10 MG tablet take 1 tablet by mouth once daily    . ofloxacin (OCUFLOX) 0.3 % ophthalmic solution instill 1 drop into both eyes four times a day  0  . timolol (TIMOPTIC) 0.25 % ophthalmic solution   0   No current facility-administered medications for this visit.     Past Medical History:  Diagnosis Date  . Glaucoma   . Hyperlipidemia   . Hypertension     Social History  Substance Use Topics  . Smoking status: Current Every Day Smoker  . Smokeless tobacco: Never Used  . Alcohol use Yes     Family History No bleeding or clotting disorders  No Known Allergies   REVIEW OF SYSTEMS(Negative unless checked)  Constitutional: [] ?Weight loss[] ?Fever[] ?Chills Cardiac:[] ?Chest pain[] ?Chest pressure[] ?Palpitations [] ?Shortness of breath when laying flat [] ?Shortness of breath at rest [x] ?Shortness of breath with exertion. Vascular: [] ?Pain in legs with walking[] ?Pain in legsat rest[] ?Pain in legs when laying flat [] ?Claudication [] ?Pain in feet when walking [] ?Pain in feet at rest [] ?Pain in feet when laying flat [] ?History of DVT [] ?Phlebitis [] ?Swelling in legs [] ?Varicose veins [] ?Non-healing ulcers Pulmonary: [] ?Uses home oxygen [] ?Productive cough[] ?Hemoptysis [] ?Wheeze [] ?COPD [] ?Asthma  Neurologic: [] ?Dizziness [] ?Blackouts [] ?Seizures [] ?History of stroke [] ?History of TIA[] ?Aphasia [] ?Temporary blindness[] ?Dysphagia [] ?Weaknessor numbness in arms [] ?Weakness or numbnessin legs Musculoskeletal: [x] ?Arthritis [] ?Joint swelling [] ?Joint pain [] ?Low back pain Hematologic:[] ?Easy bruising[] ?Easy bleeding [] ?Hypercoagulable state [] ?Anemic [] ?Hepatitis Gastrointestinal:[] ?Blood in stool[] ?Vomiting blood[] ?Gastroesophageal reflux/heartburn[] ?Difficulty swallowing. Genitourinary: [] ?Chronic kidney disease [] ?Difficulturination [] ?Frequenturination [] ?Burning with urination[] ?Blood in urine Skin: [] ?Rashes [] ?Ulcers [] ?Wounds Psychological: [] ?History of anxiety[] ?History of major depression.   Physical Examination  Vitals:   08/04/19 1117  BP: (!) 155/76  Pulse: 67  Resp: 16  Weight: 129 lb (58.5 kg)   Body mass index is 19.61 kg/m. Gen:  WD/WN, NAD Head: [place]/AT, No temporalis wasting. Ear/Nose/Throat: Hearing grossly intact, nares w/o erythema or drainage, trachea midline Eyes: Conjunctiva clear. Sclera non-icteric Neck: Supple.  No bruit  Pulmonary:  Good air movement, equal and clear to auscultation bilaterally.  Cardiac: RRR, No JVD Vascular:  Vessel Right Left  Radial Palpable Palpable               Musculoskeletal: M/S 5/5 throughout.  No deformity or atrophy. No edema. Neurologic: CN 2-12 intact. Sensation grossly intact in extremities.  Symmetrical.  Speech is fluent. Motor exam as listed above. Psychiatric: Judgment intact, Mood & affect appropriate for pt's clinical situation. Dermatologic: No rashes or ulcers noted.  No cellulitis or open wounds.      CBC No results found for: WBC, HGB, HCT, MCV, PLT  BMET No results found for: NA, K, CL, CO2, GLUCOSE, BUN, CREATININE, CALCIUM, GFRNONAA, GFRAA CrCl cannot be calculated (No successful lab value found.).  COAG No results found  for: INR, PROTIME  Radiology No results found.  Assessment/Plan Hypertension blood pressure control important in reducing the progression of atherosclerotic disease. On appropriate oral medications.   Hyperlipidemia lipid control important in reducing the progression of atherosclerotic disease. Continue statin therapy  Carotid stenosis Carotid duplex today shows his known right ICA occlusion and stable 1 to 39% left ICA stenosis. No role for intervention.  Continue aspirin and statin agent.  Recheck in 1 year     Leotis Pain, MD  08/04/2019 11:56 AM    This note was created with Dragon medical transcription system.  Any errors from dictation are purely unintentional

## 2019-08-04 NOTE — Patient Instructions (Signed)
Carotid Artery Disease  The carotid arteries are arteries on both sides of the neck. They carry blood to the brain, face, and neck. Carotid artery disease happens when these arteries become smaller (narrow) or get blocked. If these arteries become smaller or get blocked, you are more likely to have a stroke or a warning stroke (transient ischemic attack). Follow these instructions at home:  Take over-the-counter and prescription medicines only as told by your doctor.  Make sure you understand all instructions about your medicines. Do not stop taking your medicines without talking to your doctor first.  Follow your doctor's diet instructions. It is important to follow a healthy diet. ? Eat foods that include plenty of: ? Fresh fruits. ? Vegetables. ? Lean meats. ? Avoid these foods: ? Foods that are high in fat. ? Foods that are high in salt (sodium). ? Foods that are fried. ? Foods that are processed. ? Foods that have few good nutrients (poor nutritional value).  Keep a healthy weight.  Stay active. Get at least 30 minutes of activity every day.  Do not smoke.  Limit alcohol use to: ? No more than 2 drinks a day for men. ? No more than 1 drink a day for women who are not pregnant.  Do not use illegal drugs.  Keep all follow-up visits as told by your doctor. This is important. Contact a doctor if: Get help right away if:  You have any symptoms of stroke or TIA. The acronym BEFAST is an easy way to remember the main warning signs of stroke. ? B = Balance problems. Signs include dizziness, sudden trouble walking, or loss of balance ? E = Eye problems. This includes trouble seeing or a sudden change in vision. ? F = Face changes. This includes sudden weakness or numbness of the face, or the face or eyelid drooping to one side. ? A = Arm weakness or numbness. This happens suddenly and usually on one side of the body. ? S = Speech problems. This includes trouble speaking or  trouble understanding. ? T = Time. Time to call 911 or seek emergency care. Do not wait to see if symptoms go away. Make note of the time your symptoms started.  Other signs of stroke may include: ? A sudden, severe headache with no known cause. ? Feeling sick to your stomach (nauseous) or throwing up (vomiting). ? Seizure. Call your local emergency services (911 in [place] Do notdrive yourself to the clinic or hospital. Summary  The carotid arteries are arteries on both sides of the neck.  If these arteries get smaller or get blocked, you are more likely to have a stroke or a warning stroke (transient ischemic attack).  Take over-the-counter and prescription medicines only as told by your doctor.  Keep all follow-up visits as told by your doctor. This is important. This information is not intended to replace advice given to you by your health care provider. Make sure you discuss any questions you have with your health care provider. Document Released: 10/01/2012 Document Revised: 10/10/2017 Document Reviewed: 10/10/2017 Elsevier Patient Education  2020 [employer].

## 2019-08-05 DIAGNOSIS — H16401 Unspecified corneal neovascularization, right eye: Secondary | ICD-10-CM | POA: Diagnosis not present

## 2019-08-05 DIAGNOSIS — Z961 Presence of intraocular lens: Secondary | ICD-10-CM | POA: Diagnosis not present

## 2019-08-05 DIAGNOSIS — H25812 Combined forms of age-related cataract, left eye: Secondary | ICD-10-CM | POA: Diagnosis not present

## 2019-08-05 DIAGNOSIS — H179 Unspecified corneal scar and opacity: Secondary | ICD-10-CM | POA: Diagnosis not present

## 2020-01-19 DIAGNOSIS — I10 Essential (primary) hypertension: Secondary | ICD-10-CM | POA: Diagnosis not present

## 2020-01-19 DIAGNOSIS — E7800 Pure hypercholesterolemia, unspecified: Secondary | ICD-10-CM | POA: Diagnosis not present

## 2020-01-22 DIAGNOSIS — Z961 Presence of intraocular lens: Secondary | ICD-10-CM | POA: Diagnosis not present

## 2020-01-22 DIAGNOSIS — H16401 Unspecified corneal neovascularization, right eye: Secondary | ICD-10-CM | POA: Diagnosis not present

## 2020-01-22 DIAGNOSIS — H25812 Combined forms of age-related cataract, left eye: Secondary | ICD-10-CM | POA: Diagnosis not present

## 2020-01-22 DIAGNOSIS — H179 Unspecified corneal scar and opacity: Secondary | ICD-10-CM | POA: Diagnosis not present

## 2020-01-26 DIAGNOSIS — I10 Essential (primary) hypertension: Secondary | ICD-10-CM | POA: Diagnosis not present

## 2020-01-26 DIAGNOSIS — Z79899 Other long term (current) drug therapy: Secondary | ICD-10-CM | POA: Diagnosis not present

## 2020-01-26 DIAGNOSIS — F17210 Nicotine dependence, cigarettes, uncomplicated: Secondary | ICD-10-CM | POA: Diagnosis not present

## 2020-01-26 DIAGNOSIS — E7800 Pure hypercholesterolemia, unspecified: Secondary | ICD-10-CM | POA: Diagnosis not present

## 2020-01-26 DIAGNOSIS — I6523 Occlusion and stenosis of bilateral carotid arteries: Secondary | ICD-10-CM | POA: Diagnosis not present

## 2020-01-26 DIAGNOSIS — R3121 Asymptomatic microscopic hematuria: Secondary | ICD-10-CM | POA: Diagnosis not present

## 2020-01-26 DIAGNOSIS — Z0000 Encounter for general adult medical examination without abnormal findings: Secondary | ICD-10-CM | POA: Diagnosis not present

## 2020-02-04 DIAGNOSIS — H18231 Secondary corneal edema, right eye: Secondary | ICD-10-CM | POA: Diagnosis not present

## 2020-02-04 DIAGNOSIS — H2512 Age-related nuclear cataract, left eye: Secondary | ICD-10-CM | POA: Diagnosis not present

## 2020-02-04 DIAGNOSIS — H40022 Open angle with borderline findings, high risk, left eye: Secondary | ICD-10-CM | POA: Diagnosis not present

## 2020-02-04 DIAGNOSIS — H4089 Other specified glaucoma: Secondary | ICD-10-CM | POA: Diagnosis not present

## 2020-06-09 DIAGNOSIS — H18231 Secondary corneal edema, right eye: Secondary | ICD-10-CM | POA: Diagnosis not present

## 2020-06-09 DIAGNOSIS — H2512 Age-related nuclear cataract, left eye: Secondary | ICD-10-CM | POA: Diagnosis not present

## 2020-06-09 DIAGNOSIS — H4089 Other specified glaucoma: Secondary | ICD-10-CM | POA: Diagnosis not present

## 2020-06-09 DIAGNOSIS — H40022 Open angle with borderline findings, high risk, left eye: Secondary | ICD-10-CM | POA: Diagnosis not present

## 2020-07-26 DIAGNOSIS — E7800 Pure hypercholesterolemia, unspecified: Secondary | ICD-10-CM | POA: Diagnosis not present

## 2020-07-26 DIAGNOSIS — I10 Essential (primary) hypertension: Secondary | ICD-10-CM | POA: Diagnosis not present

## 2020-07-26 DIAGNOSIS — Z125 Encounter for screening for malignant neoplasm of prostate: Secondary | ICD-10-CM | POA: Diagnosis not present

## 2020-08-02 DIAGNOSIS — E7800 Pure hypercholesterolemia, unspecified: Secondary | ICD-10-CM | POA: Diagnosis not present

## 2020-08-02 DIAGNOSIS — I10 Essential (primary) hypertension: Secondary | ICD-10-CM | POA: Diagnosis not present

## 2020-08-02 DIAGNOSIS — I6523 Occlusion and stenosis of bilateral carotid arteries: Secondary | ICD-10-CM | POA: Diagnosis not present

## 2020-08-02 DIAGNOSIS — Z720 Tobacco use: Secondary | ICD-10-CM | POA: Diagnosis not present

## 2020-08-02 DIAGNOSIS — Z0001 Encounter for general adult medical examination with abnormal findings: Secondary | ICD-10-CM | POA: Diagnosis not present

## 2020-08-05 ENCOUNTER — Encounter (INDEPENDENT_AMBULATORY_CARE_PROVIDER_SITE_OTHER): Payer: Self-pay | Admitting: Vascular Surgery

## 2020-08-05 ENCOUNTER — Ambulatory Visit (INDEPENDENT_AMBULATORY_CARE_PROVIDER_SITE_OTHER): Payer: Medicare HMO

## 2020-08-05 ENCOUNTER — Other Ambulatory Visit: Payer: Self-pay

## 2020-08-05 ENCOUNTER — Ambulatory Visit (INDEPENDENT_AMBULATORY_CARE_PROVIDER_SITE_OTHER): Payer: Medicare HMO | Admitting: Vascular Surgery

## 2020-08-05 VITALS — BP 185/81 | HR 67 | Ht 68.0 in | Wt 132.0 lb

## 2020-08-05 DIAGNOSIS — E785 Hyperlipidemia, unspecified: Secondary | ICD-10-CM | POA: Diagnosis not present

## 2020-08-05 DIAGNOSIS — I10 Essential (primary) hypertension: Secondary | ICD-10-CM | POA: Diagnosis not present

## 2020-08-05 DIAGNOSIS — I6523 Occlusion and stenosis of bilateral carotid arteries: Secondary | ICD-10-CM | POA: Diagnosis not present

## 2020-08-05 NOTE — Progress Notes (Signed)
MRN : [id number]  Cody Williams is a 79 y.o. ([birth year]) male who presents with chief complaint of  Chief Complaint  Patient presents with  . Follow-up    1 year carotid U/S   .  History of Present Illness: Patient returns in follow-up of his carotid disease.  He is doing well today without complaints.  He denies any focal neurologic symptoms. Specifically, the patient denies amaurosis fugax, speech or swallowing difficulties, or arm or leg weakness or numbness.  Duplex shows a known occlusion of the right carotid artery with stable 1 to 39% left ICA stenosis.  Current Outpatient Medications  Medication Sig Dispense Refill  . amLODipine (NORVASC) 5 MG tablet take 1 tablet by mouth once daily    . aspirin EC 81 MG tablet Take 81 mg by mouth daily.    Marland Kitchen atorvastatin (LIPITOR) 10 MG tablet take 1 tablet by mouth once daily    . timolol (TIMOPTIC) 0.25 % ophthalmic solution   0  . ofloxacin (OCUFLOX) 0.3 % ophthalmic solution instill 1 drop into both eyes four times a day (Patient not taking: Reported on 08/05/2020)  0   No current facility-administered medications for this visit.    Past Medical History:  Diagnosis Date  . Glaucoma   . Hyperlipidemia   . Hypertension      Social History   Tobacco Use  . Smoking status: Former Smoker    Quit date: 04/15/2018    Years since quitting: 2.3  . Smokeless tobacco: Never Used  Substance Use Topics  . Alcohol use: Yes  . Drug use: No      Family History  Problem Relation Age of Onset  . Varicose Veins Neg Hx   . Vision loss Neg Hx     No Known Allergies   REVIEW OF SYSTEMS (Negative unless checked)  Constitutional: [] Weight loss  [] Fever  [] Chills Cardiac: [] Chest pain   [] Chest pressure   [] Palpitations   [] Shortness of breath when laying flat   [] Shortness of breath at rest   [] Shortness of breath with exertion. Vascular:  [] Pain in legs with walking   [] Pain in legs at rest   [] Pain in legs when laying flat    [] Claudication   [] Pain in feet when walking  [] Pain in feet at rest  [] Pain in feet when laying flat   [] History of DVT   [] Phlebitis   [] Swelling in legs   [] Varicose veins   [] Non-healing ulcers Pulmonary:   [] Uses home oxygen   [] Productive cough   [] Hemoptysis   [] Wheeze  [] COPD   [] Asthma Neurologic:  [] Dizziness  [] Blackouts   [] Seizures   [] History of stroke   [] History of TIA  [] Aphasia   [] Temporary blindness   [] Dysphagia   [] Weakness or numbness in arms   [] Weakness or numbness in legs Musculoskeletal:  [x] Arthritis   [] Joint swelling   [] Joint pain   [] Low back pain Hematologic:  [] Easy bruising  [] Easy bleeding   [] Hypercoagulable state   [] Anemic  [] Hepatitis Gastrointestinal:  [] Blood in stool   [] Vomiting blood  [] Gastroesophageal reflux/heartburn   [] Difficulty swallowing. Genitourinary:  [] Chronic kidney disease   [] Difficult urination  [] Frequent urination  [] Burning with urination   [] Blood in urine Skin:  [] Rashes   [] Ulcers   [] Wounds Psychological:  [] History of anxiety   []  History of major depression.  Physical Examination  Vitals:   08/05/20 1135  BP: (!) 185/81  Pulse: 67  Weight: 132 lb (59.9 kg)  Height: 5\' 8"  (1.727 m)   Body mass index is 20.07 kg/m. Gen:  WD/WN, NAD Head: [place]/AT, + temporalis wasting. Ear/Nose/Throat: Hearing grossly intact, nares w/o erythema or drainage, trachea midline Eyes: Conjunctiva clear. Sclera non-icteric Neck: Supple.  No bruit  Pulmonary:  Good air movement, equal and clear to auscultation bilaterally.  Cardiac: RRR, No JVD Vascular:  Vessel Right Left  Radial Palpable Palpable       Musculoskeletal: M/S 5/5 throughout.  No deformity or atrophy. No edema. Neurologic: CN 2-12 intact. Sensation grossly intact in extremities.  Symmetrical.  Speech is fluent. Motor exam as listed above. Psychiatric: Judgment intact, Mood & affect appropriate for pt's clinical situation. Dermatologic: No rashes or ulcers noted.  No cellulitis  or open wounds.      CBC No results found for: WBC, HGB, HCT, MCV, PLT  BMET No results found for: NA, K, CL, CO2, GLUCOSE, BUN, CREATININE, CALCIUM, GFRNONAA, GFRAA CrCl cannot be calculated (No successful lab value found.).  COAG No results found for: INR, PROTIME  Radiology No results found.   Assessment/Plan Hypertension blood pressure control important in reducing the progression of atherosclerotic disease. On appropriate oral medications.   Hyperlipidemia lipid control important in reducing the progression of atherosclerotic disease. Continue statin therapy  Carotid stenosis Carotid duplex today shows his known right ICA occlusion and stable 1 to 39% left ICA stenosis. No role for intervention. Continue aspirin and statin agent. Recheck in 1 year   Leotis Pain, MD  08/05/2020 12:41 PM    This note was created with Dragon medical transcription system.  Any errors from dictation are purely unintentional

## 2020-08-18 DIAGNOSIS — H18231 Secondary corneal edema, right eye: Secondary | ICD-10-CM | POA: Diagnosis not present

## 2020-08-18 DIAGNOSIS — H40022 Open angle with borderline findings, high risk, left eye: Secondary | ICD-10-CM | POA: Diagnosis not present

## 2020-08-18 DIAGNOSIS — H2512 Age-related nuclear cataract, left eye: Secondary | ICD-10-CM | POA: Diagnosis not present

## 2020-08-18 DIAGNOSIS — H4089 Other specified glaucoma: Secondary | ICD-10-CM | POA: Diagnosis not present

## 2020-08-31 DIAGNOSIS — Z0100 Encounter for examination of eyes and vision without abnormal findings: Secondary | ICD-10-CM | POA: Diagnosis not present

## 2020-08-31 DIAGNOSIS — H524 Presbyopia: Secondary | ICD-10-CM | POA: Diagnosis not present

## 2020-12-22 DIAGNOSIS — H40022 Open angle with borderline findings, high risk, left eye: Secondary | ICD-10-CM | POA: Diagnosis not present

## 2020-12-22 DIAGNOSIS — H4089 Other specified glaucoma: Secondary | ICD-10-CM | POA: Diagnosis not present

## 2020-12-22 DIAGNOSIS — H18231 Secondary corneal edema, right eye: Secondary | ICD-10-CM | POA: Diagnosis not present

## 2020-12-22 DIAGNOSIS — H2512 Age-related nuclear cataract, left eye: Secondary | ICD-10-CM | POA: Diagnosis not present

## 2021-01-24 DIAGNOSIS — I10 Essential (primary) hypertension: Secondary | ICD-10-CM | POA: Diagnosis not present

## 2021-01-31 DIAGNOSIS — I10 Essential (primary) hypertension: Secondary | ICD-10-CM | POA: Diagnosis not present

## 2021-01-31 DIAGNOSIS — E7800 Pure hypercholesterolemia, unspecified: Secondary | ICD-10-CM | POA: Diagnosis not present

## 2021-01-31 DIAGNOSIS — H40003 Preglaucoma, unspecified, bilateral: Secondary | ICD-10-CM | POA: Diagnosis not present

## 2021-01-31 DIAGNOSIS — Z125 Encounter for screening for malignant neoplasm of prostate: Secondary | ICD-10-CM | POA: Diagnosis not present

## 2021-01-31 DIAGNOSIS — I6523 Occlusion and stenosis of bilateral carotid arteries: Secondary | ICD-10-CM | POA: Diagnosis not present

## 2021-01-31 DIAGNOSIS — Z0000 Encounter for general adult medical examination without abnormal findings: Secondary | ICD-10-CM | POA: Diagnosis not present

## 2021-01-31 DIAGNOSIS — R6889 Other general symptoms and signs: Secondary | ICD-10-CM | POA: Insufficient documentation

## 2021-05-25 DIAGNOSIS — H40022 Open angle with borderline findings, high risk, left eye: Secondary | ICD-10-CM | POA: Diagnosis not present

## 2021-05-25 DIAGNOSIS — H4089 Other specified glaucoma: Secondary | ICD-10-CM | POA: Diagnosis not present

## 2021-05-25 DIAGNOSIS — H18231 Secondary corneal edema, right eye: Secondary | ICD-10-CM | POA: Diagnosis not present

## 2021-07-27 DIAGNOSIS — Z125 Encounter for screening for malignant neoplasm of prostate: Secondary | ICD-10-CM | POA: Diagnosis not present

## 2021-07-27 DIAGNOSIS — I10 Essential (primary) hypertension: Secondary | ICD-10-CM | POA: Diagnosis not present

## 2021-08-03 DIAGNOSIS — Z23 Encounter for immunization: Secondary | ICD-10-CM | POA: Diagnosis not present

## 2021-08-03 DIAGNOSIS — H409 Unspecified glaucoma: Secondary | ICD-10-CM | POA: Diagnosis not present

## 2021-08-03 DIAGNOSIS — I10 Essential (primary) hypertension: Secondary | ICD-10-CM | POA: Diagnosis not present

## 2021-08-03 DIAGNOSIS — Z125 Encounter for screening for malignant neoplasm of prostate: Secondary | ICD-10-CM | POA: Diagnosis not present

## 2021-08-03 DIAGNOSIS — E7800 Pure hypercholesterolemia, unspecified: Secondary | ICD-10-CM | POA: Diagnosis not present

## 2021-08-03 DIAGNOSIS — Z0001 Encounter for general adult medical examination with abnormal findings: Secondary | ICD-10-CM | POA: Diagnosis not present

## 2021-08-04 ENCOUNTER — Ambulatory Visit (INDEPENDENT_AMBULATORY_CARE_PROVIDER_SITE_OTHER): Payer: Medicare HMO

## 2021-08-04 ENCOUNTER — Other Ambulatory Visit: Payer: Self-pay

## 2021-08-04 ENCOUNTER — Ambulatory Visit (INDEPENDENT_AMBULATORY_CARE_PROVIDER_SITE_OTHER): Payer: Medicare HMO | Admitting: Nurse Practitioner

## 2021-08-04 VITALS — BP 184/76 | HR 84 | Ht 68.0 in | Wt 125.0 lb

## 2021-08-04 DIAGNOSIS — I10 Essential (primary) hypertension: Secondary | ICD-10-CM

## 2021-08-04 DIAGNOSIS — I6523 Occlusion and stenosis of bilateral carotid arteries: Secondary | ICD-10-CM

## 2021-08-04 DIAGNOSIS — E785 Hyperlipidemia, unspecified: Secondary | ICD-10-CM | POA: Diagnosis not present

## 2021-08-05 ENCOUNTER — Encounter (INDEPENDENT_AMBULATORY_CARE_PROVIDER_SITE_OTHER): Payer: Self-pay | Admitting: Nurse Practitioner

## 2021-08-05 NOTE — Progress Notes (Signed)
Subjective:    Patient ID: Cody Williams, male    DOB: Sep 01, 1941, 80 y.o.   MRN: [id number] Chief Complaint  Patient presents with   Follow-up    89yr Carotid    [name] is a 80 year old male that presents today for follow-up in regards to his carotid artery stenosis.The carotid stenosis followed by ultrasound.   The patient denies amaurosis fugax. There is no recent history of TIA symptoms or focal motor deficits. There is no prior documented CVA.  The patient is taking enteric-coated aspirin 81 mg daily.  There is no history of migraine headaches. There is no history of seizures.  The patient has a history of coronary artery disease, no recent episodes of angina or shortness of breath. The patient denies PAD or claudication symptoms. There is a history of hyperlipidemia which is being treated with a statin.    Carotid Duplex done today shows a known occluded right ICA with 1 to 39% stenosis of the left ICA.Marland Kitchen  No change compared to last study in 08/05/2020   Review of Systems  All other systems reviewed and are negative.     Objective:   Physical Exam Vitals reviewed.  HENT:     Head: Normocephalic.  Neck:     Vascular: Carotid bruit present.  Cardiovascular:     Rate and Rhythm: Normal rate.  Pulmonary:     Effort: Pulmonary effort is normal.  Skin:    General: Skin is warm and dry.  Neurological:     Mental Status: He is alert and oriented to person, place, and time.  Psychiatric:        Mood and Affect: Mood normal.        Behavior: Behavior normal.        Thought Content: Thought content normal.        Judgment: Judgment normal.    BP (!) 184/76   Pulse 84   Ht 5\' 8"  (1.727 m)   Wt 125 lb (56.7 kg)   BMI 19.01 kg/m   Past Medical History:  Diagnosis Date   Glaucoma    Hyperlipidemia    Hypertension     Social History   Socioeconomic History   Marital status: Unknown    Spouse name: Not on file   Number of children: Not on file    Years of education: Not on file   Highest education level: Not on file  Occupational History   Not on file  Tobacco Use   Smoking status: Former    Types: Cigarettes    Quit date: 04/15/2018    Years since quitting: 3.3   Smokeless tobacco: Never  Substance and Sexual Activity   Alcohol use: Yes   Drug use: No   Sexual activity: Not on file  Other Topics Concern   Not on file  Social History Narrative   Not on file   Social Determinants of Health   Financial Resource Strain: Not on file  Food Insecurity: Not on file  Transportation Needs: Not on file  Physical Activity: Not on file  Stress: Not on file  Social Connections: Not on file  Intimate Partner Violence: Not on file    Past Surgical History:  Procedure Laterality Date   EYE SURGERY      Family History  Problem Relation Age of Onset   Varicose Veins Neg Hx    Vision loss Neg Hx     No Known Allergies  No flowsheet data found.  CMP  No results found for: NA, K, CL, CO2, GLUCOSE, BUN, CREATININE, CALCIUM, PROT, ALBUMIN, AST, ALT, ALKPHOS, BILITOT, GFRNONAA, GFRAA   No results found.     Assessment & Plan:   1. Bilateral carotid artery stenosis Recommend:  Given the patient's asymptomatic subcritical stenosis no further invasive testing or surgery at this time.  Duplex ultrasound shows an occluded right ICA (previously known) with a 1 to 39% stenosis of the left ICA.  Continue antiplatelet therapy as prescribed Continue management of CAD, HTN and Hyperlipidemia Healthy heart diet,  encouraged exercise at least 4 times per week Follow up in 12 months with duplex ultrasound and physical exam    2. Primary hypertension The patient's blood pressure is elevated today.  He notes that he may have forgotten his medicine today.  Patient is advised to take his medicine as soon as he arrives home and to continue taking it daily.  3. Hyperlipidemia, unspecified hyperlipidemia type Continue statin as  ordered and reviewed, no changes at this time    Current Outpatient Medications on File Prior to Visit  Medication Sig Dispense Refill   amLODipine (NORVASC) 5 MG tablet take 1 tablet by mouth once daily     aspirin EC 81 MG tablet Take 81 mg by mouth daily.     atorvastatin (LIPITOR) 10 MG tablet take 1 tablet by mouth once daily     ofloxacin (OCUFLOX) 0.3 % ophthalmic solution instill 1 drop into both eyes four times a day  0   timolol (TIMOPTIC) 0.25 % ophthalmic solution   0   No current facility-administered medications on file prior to visit.    There are no Patient Instructions on file for this visit. No follow-ups on file.   Cody Hartmann, NP

## 2021-08-30 ENCOUNTER — Encounter: Payer: Self-pay | Admitting: Emergency Medicine

## 2021-08-30 ENCOUNTER — Other Ambulatory Visit: Payer: Self-pay

## 2021-08-30 ENCOUNTER — Emergency Department: Payer: Medicare HMO

## 2021-08-30 ENCOUNTER — Observation Stay: Payer: Medicare HMO

## 2021-08-30 ENCOUNTER — Inpatient Hospital Stay
Admission: EM | Admit: 2021-08-30 | Discharge: 2021-09-03 | DRG: 180 | Disposition: A | Discharge status: Home Health | Payer: Medicare HMO | Attending: Internal Medicine | Admitting: Internal Medicine

## 2021-08-30 DIAGNOSIS — J849 Interstitial pulmonary disease, unspecified: Secondary | ICD-10-CM | POA: Diagnosis present

## 2021-08-30 DIAGNOSIS — J910 Malignant pleural effusion: Secondary | ICD-10-CM | POA: Diagnosis present

## 2021-08-30 DIAGNOSIS — F17210 Nicotine dependence, cigarettes, uncomplicated: Secondary | ICD-10-CM | POA: Diagnosis present

## 2021-08-30 DIAGNOSIS — R599 Enlarged lymph nodes, unspecified: Secondary | ICD-10-CM | POA: Diagnosis not present

## 2021-08-30 DIAGNOSIS — H409 Unspecified glaucoma: Secondary | ICD-10-CM | POA: Diagnosis present

## 2021-08-30 DIAGNOSIS — E43 Unspecified severe protein-calorie malnutrition: Secondary | ICD-10-CM | POA: Diagnosis present

## 2021-08-30 DIAGNOSIS — Z20822 Contact with and (suspected) exposure to covid-19: Secondary | ICD-10-CM | POA: Diagnosis present

## 2021-08-30 DIAGNOSIS — Z720 Tobacco use: Secondary | ICD-10-CM | POA: Diagnosis present

## 2021-08-30 DIAGNOSIS — I10 Essential (primary) hypertension: Secondary | ICD-10-CM | POA: Diagnosis present

## 2021-08-30 DIAGNOSIS — Z515 Encounter for palliative care: Secondary | ICD-10-CM | POA: Diagnosis not present

## 2021-08-30 DIAGNOSIS — R6521 Severe sepsis with septic shock: Secondary | ICD-10-CM | POA: Diagnosis present

## 2021-08-30 DIAGNOSIS — I700 Atherosclerosis of aorta: Secondary | ICD-10-CM | POA: Diagnosis not present

## 2021-08-30 DIAGNOSIS — R6510 Systemic inflammatory response syndrome (SIRS) of non-infectious origin without acute organ dysfunction: Secondary | ICD-10-CM | POA: Diagnosis present

## 2021-08-30 DIAGNOSIS — J90 Pleural effusion, not elsewhere classified: Secondary | ICD-10-CM | POA: Diagnosis present

## 2021-08-30 DIAGNOSIS — R5381 Other malaise: Secondary | ICD-10-CM | POA: Diagnosis present

## 2021-08-30 DIAGNOSIS — R918 Other nonspecific abnormal finding of lung field: Secondary | ICD-10-CM | POA: Diagnosis not present

## 2021-08-30 DIAGNOSIS — J9601 Acute respiratory failure with hypoxia: Secondary | ICD-10-CM | POA: Diagnosis present

## 2021-08-30 DIAGNOSIS — C782 Secondary malignant neoplasm of pleura: Secondary | ICD-10-CM | POA: Diagnosis present

## 2021-08-30 DIAGNOSIS — J439 Emphysema, unspecified: Secondary | ICD-10-CM | POA: Diagnosis present

## 2021-08-30 DIAGNOSIS — H5461 Unqualified visual loss, right eye, normal vision left eye: Secondary | ICD-10-CM | POA: Diagnosis present

## 2021-08-30 DIAGNOSIS — K5730 Diverticulosis of large intestine without perforation or abscess without bleeding: Secondary | ICD-10-CM | POA: Diagnosis not present

## 2021-08-30 DIAGNOSIS — Z681 Body mass index (BMI) 19 or less, adult: Secondary | ICD-10-CM | POA: Diagnosis not present

## 2021-08-30 DIAGNOSIS — Z79899 Other long term (current) drug therapy: Secondary | ICD-10-CM | POA: Diagnosis not present

## 2021-08-30 DIAGNOSIS — E785 Hyperlipidemia, unspecified: Secondary | ICD-10-CM | POA: Diagnosis present

## 2021-08-30 DIAGNOSIS — D72829 Elevated white blood cell count, unspecified: Secondary | ICD-10-CM | POA: Diagnosis present

## 2021-08-30 DIAGNOSIS — M47816 Spondylosis without myelopathy or radiculopathy, lumbar region: Secondary | ICD-10-CM | POA: Diagnosis not present

## 2021-08-30 DIAGNOSIS — R531 Weakness: Secondary | ICD-10-CM | POA: Diagnosis not present

## 2021-08-30 DIAGNOSIS — R0602 Shortness of breath: Secondary | ICD-10-CM | POA: Diagnosis not present

## 2021-08-30 DIAGNOSIS — A419 Sepsis, unspecified organism: Secondary | ICD-10-CM | POA: Diagnosis present

## 2021-08-30 DIAGNOSIS — Z7982 Long term (current) use of aspirin: Secondary | ICD-10-CM

## 2021-08-30 DIAGNOSIS — I6529 Occlusion and stenosis of unspecified carotid artery: Secondary | ICD-10-CM | POA: Diagnosis present

## 2021-08-30 DIAGNOSIS — Z48813 Encounter for surgical aftercare following surgery on the respiratory system: Secondary | ICD-10-CM | POA: Diagnosis not present

## 2021-08-30 DIAGNOSIS — I619 Nontraumatic intracerebral hemorrhage, unspecified: Secondary | ICD-10-CM | POA: Diagnosis not present

## 2021-08-30 DIAGNOSIS — C801 Malignant (primary) neoplasm, unspecified: Secondary | ICD-10-CM | POA: Diagnosis not present

## 2021-08-30 LAB — TROPONIN I (HIGH SENSITIVITY)
Troponin I (High Sensitivity): 24 ng/L — ABNORMAL HIGH (ref <18)
Troponin I (High Sensitivity): 64 ng/L — ABNORMAL HIGH (ref <18)

## 2021-08-30 LAB — CBC
HCT: 41.1 % (ref 39.0–52.0)
Hemoglobin: 13.3 g/dL (ref 13.0–17.0)
MCH: 28.2 pg (ref 26.0–34.0)
MCHC: 32.4 g/dL (ref 30.0–36.0)
MCV: 87.1 fL (ref 80.0–100.0)
Platelets: 713 10*3/uL — ABNORMAL HIGH (ref 150–400)
RBC: 4.72 MIL/uL (ref 4.22–5.81)
RDW: 13.6 % (ref 11.5–15.5)
WBC: 18.2 10*3/uL — ABNORMAL HIGH (ref 4.0–10.5)
nRBC: 0 % (ref 0.0–0.2)

## 2021-08-30 LAB — URINALYSIS, ROUTINE W REFLEX MICROSCOPIC
Bacteria, UA: NONE SEEN
Bilirubin Urine: NEGATIVE
Glucose, UA: NEGATIVE mg/dL
Hgb urine dipstick: NEGATIVE
Ketones, ur: 5 mg/dL — AB
Leukocytes,Ua: NEGATIVE
Nitrite: NEGATIVE
Protein, ur: 30 mg/dL — AB
Specific Gravity, Urine: 1.028 (ref 1.005–1.030)
pH: 5 (ref 5.0–8.0)

## 2021-08-30 LAB — BRAIN NATRIURETIC PEPTIDE: B Natriuretic Peptide: 85 pg/mL (ref 0.0–100.0)

## 2021-08-30 LAB — BASIC METABOLIC PANEL
Anion gap: 11 (ref 5–15)
BUN: 35 mg/dL — ABNORMAL HIGH (ref 8–23)
CO2: 30 mmol/L (ref 22–32)
Calcium: 9.5 mg/dL (ref 8.9–10.3)
Chloride: 100 mmol/L (ref 98–111)
Creatinine, Ser: 0.89 mg/dL (ref 0.61–1.24)
GFR, Estimated: >60 mL/min (ref >60)
Glucose, Bld: 123 mg/dL — ABNORMAL HIGH (ref 70–99)
Potassium: 4.6 mmol/L (ref 3.5–5.1)
Sodium: 141 mmol/L (ref 135–145)

## 2021-08-30 LAB — PROCALCITONIN: Procalcitonin: 0.1 ng/mL

## 2021-08-30 LAB — RESP PANEL BY RT-PCR (FLU A&B, COVID) ARPGX2
Influenza A by PCR: NEGATIVE
Influenza B by PCR: NEGATIVE
SARS Coronavirus 2 by RT PCR: NEGATIVE

## 2021-08-30 MED ORDER — SODIUM CHLORIDE 0.9 % IV SOLN
1.0000 g | Freq: Once | INTRAVENOUS | Status: AC
  Administered 2021-08-30: 1 g via INTRAVENOUS
  Filled 2021-08-30: qty 10

## 2021-08-30 MED ORDER — ONDANSETRON HCL 4 MG/2ML IJ SOLN: 4.0000 mg | Freq: Four times a day (QID) | INTRAMUSCULAR | Status: DC | PRN

## 2021-08-30 MED ORDER — SODIUM CHLORIDE 0.9 % IV SOLN
500.0000 mg | INTRAVENOUS | Status: AC
  Administered 2021-08-31 – 2021-09-01 (×2): 500 mg via INTRAVENOUS
  Filled 2021-08-30 (×2): qty 500

## 2021-08-30 MED ORDER — SODIUM CHLORIDE 0.9 % IV SOLN
1.0000 g | Freq: Once | INTRAVENOUS | Status: DC
  Administered 2021-08-30: 1 g via INTRAVENOUS
  Filled 2021-08-30: qty 10

## 2021-08-30 MED ORDER — AMLODIPINE BESYLATE 5 MG PO TABS
5.0000 mg | ORAL_TABLET | Freq: Every day | ORAL | Status: DC
  Administered 2021-08-31 – 2021-09-03 (×4): 5 mg via ORAL
  Filled 2021-08-30 (×4): qty 1

## 2021-08-30 MED ORDER — ATORVASTATIN CALCIUM 20 MG PO TABS
10.0000 mg | ORAL_TABLET | Freq: Every day | ORAL | Status: DC
  Administered 2021-08-30 – 2021-09-02 (×4): 10 mg via ORAL
  Filled 2021-08-30 (×4): qty 1

## 2021-08-30 MED ORDER — HEPARIN SODIUM (PORCINE) 5000 UNIT/ML IJ SOLN
5000.0000 [IU] | Freq: Three times a day (TID) | INTRAMUSCULAR | Status: DC
  Administered 2021-08-31 – 2021-09-03 (×10): 5000 [IU] via SUBCUTANEOUS
  Filled 2021-08-30 (×10): qty 1

## 2021-08-30 MED ORDER — ACETAMINOPHEN 650 MG RE SUPP: 650.0000 mg | Freq: Four times a day (QID) | RECTAL | Status: AC | PRN

## 2021-08-30 MED ORDER — DORZOLAMIDE HCL-TIMOLOL MAL 2-0.5 % OP SOLN
1.0000 [drp] | Freq: Two times a day (BID) | OPHTHALMIC | Status: DC
  Administered 2021-08-31 – 2021-09-03 (×7): 1 [drp] via OPHTHALMIC
  Filled 2021-08-30 (×3): qty 10

## 2021-08-30 MED ORDER — SODIUM CHLORIDE 0.9 % IV SOLN
500.0000 mg | Freq: Once | INTRAVENOUS | Status: AC
  Administered 2021-08-30: 500 mg via INTRAVENOUS
  Filled 2021-08-30: qty 500

## 2021-08-30 MED ORDER — SODIUM CHLORIDE 0.9 % IV SOLN: 2.0000 g | INTRAVENOUS | Status: DC

## 2021-08-30 MED ORDER — ACETAMINOPHEN 325 MG PO TABS: 650.0000 mg | ORAL_TABLET | Freq: Four times a day (QID) | ORAL | Status: AC | PRN

## 2021-08-30 MED ORDER — SODIUM CHLORIDE 0.9 % IV SOLN
2.0000 g | INTRAVENOUS | Status: DC
  Administered 2021-08-31 – 2021-09-02 (×3): 2 g via INTRAVENOUS
  Filled 2021-08-30 (×4): qty 20

## 2021-08-30 MED ORDER — HEPARIN SODIUM (PORCINE) 5000 UNIT/ML IJ SOLN: 5000.0000 [IU] | Freq: Three times a day (TID) | INTRAMUSCULAR | Status: DC

## 2021-08-30 MED ORDER — ONDANSETRON HCL 4 MG PO TABS: 4.0000 mg | ORAL_TABLET | Freq: Four times a day (QID) | ORAL | Status: DC | PRN

## 2021-08-30 MED ORDER — NICOTINE 21 MG/24HR TD PT24: 21.0000 mg | MEDICATED_PATCH | Freq: Every day | TRANSDERMAL | Status: DC | PRN

## 2021-08-30 NOTE — H&P (Addendum)
History and Physical   Cody Williams NLZ:[id number] DOB: 10/08/1941 DOA: 08/30/2021  PCP: Baxter Hire, MD  Outpatient Specialists: Dr. Lucky Cowboy, vascular Patient coming from: home via [place]  I have personally briefly reviewed patient's old medical records in [place].  Chief Concern: Shortness of breath and cough  HPI: Cody Williams is a 80 y.o. male with medical history significant for tobacco use, hypertension, hyperlipidemia, glaucoma with right eye blindness, who presents emergency department for chief concerns of shortness of breath and cough.  At bedside, patient appears cahectic, alert and oriented to name, age, sister, Legrand Rams at bedside, current president of [place], current location of hospital.  He reports the shortness of breath and cough for three weeks. He endorses coughing up foamy, clear phlegm and poor PO intake of food and drinks.  His symptoms have not gotten better, and family was able to convince him to present to the emergency department for further evaluation.  He endorses a 5 pound unintentiuonal weight loss in 1 year and about 8 pounds in two years total. He denies known sick contacts.   He reports that he is not on oxygen supplementation.  He reports that the nasal cannula oxygen supplementation has improved his shortness of breath significantly.  He last had a bowel movement was today prior to ED. He reports his stool is occasionally pencil thin.   Health maintenance: he has never had a colonoscopy  Social history: He lives at home with his brother. He started smoking at age 40, at his peak he smoked 1 ppd, he quit in 2019. He infrequently drinks beer, about two per month. He last had two beers in October 1. He formerly worked in a [employer] educational needs teacher.  Vaccination history: he is vaccinated for covid 19, two doses of Moderna  ROS: Constitutional: + weight change, no fever ENT/Mouth: no sore throat, no rhinorrhea Eyes: no eye pain, no vision  changes Cardiovascular: no chest pain, + dyspnea,  no edema, no palpitations Respiratory: + cough, + sputum, no wheezing Gastrointestinal: no nausea, no vomiting, no diarrhea, no constipation Genitourinary: no urinary incontinence, no dysuria, no hematuria Musculoskeletal: no arthralgias, no myalgias Skin: no skin lesions, no pruritus, Neuro: + weakness, no loss of consciousness, no syncope Psych: no anxiety, no depression, + decreased appetite Heme/Lymph: no bruising, no bleeding  ED Course: Discussed with emergency medicine provider, patient requiring hospitalization for large right pleural effusion.  Vitals in the emergency department was remarkable for temperature of 98.6, respiration rate of 22 and increased to 28, heart rate of 108, blood pressure 132/87 and increased to 150/85, SPO2 of 91% on room air.  Labs in the emergency department was remarkable for sodium 141, potassium 4.6, chloride 100, bicarb 30, BUN of 35, serum creatinine of 0.89, nonfasting blood glucose 123, GFR was greater than 60, WBC of 18.2, hemoglobin 13.3, platelets 713.  In the emergency department patient was given ceftriaxone 1 g IV, azithromycin.  ED provider consulted with IR for pleural effusion and lab work-up of thoracentesis aspirate.  Assessment/Plan  Principal Problem:   Pleural effusion on right Active Problems:   Hypertension   Hyperlipidemia   Carotid stenosis   Current tobacco use   Leukocytosis   SIRS due to infectious process with acute organ dysfunction ([place])   Sepsis ([place])   # New right pleural effusion-etiology work-up in progress - High clinical suspicion for carcinoma at this time given patient's history of current tobacco use - Ultrasound-guided thoracentesis has been ordered with  labs including body fluid cell count with differentials, body fluid culture, cytology, protein, body fluid lactic dehydrogenase - A.m. team to consider CTA status post pleural effusion to assess for  possible carcinoma  # Meet sepsis criteria with increased respiration rate, heart rate, leukocytosis, possible source of pneumonia - Procalcitonin ordered - Blood cultures x2 - Continue azithromycin 500 mg IV, ceftriaxone 2 g IV  # Hyperlipidemia-atorvastatin 10 mg nightly resumed  # Hypertension-amlodipine 5 mg daily resumed  # Tobacco use-nicotine patch as needed for nicotine craving ordered  # Health maintenance-advised patient that he will need colonoscopy on discharge - Discussed the risk and benefits of colonoscopy with patient - Patient and his sister at bedside endorses understanding and compliance  Chart reviewed.   DVT prophylaxis: heparin 5000 u subcu q8h Code Status: full code   Diet: heart healthy Family Communication: Updated sister, Legrand Rams at bedside, with patient's permission Disposition Plan: Pending clinical course Consults called: Interventional radiology Admission status: MedSurg, observation, telemetry  Past Medical History:  Diagnosis Date   Glaucoma    Hyperlipidemia    Hypertension    Past Surgical History:  Procedure Laterality Date   EYE SURGERY     Social History:  reports that he quit smoking about 3 years ago. He has never used smokeless tobacco. He reports current alcohol use. He reports that he does not use drugs.  No Known Allergies Family History  Problem Relation Age of Onset   Varicose Veins Neg Hx    Vision loss Neg Hx    Family history: Family history reviewed and not pertinent  Prior to Admission medications   Medication Sig Start Date End Date Taking? Authorizing Provider  amLODipine (NORVASC) 5 MG tablet take 1 tablet by mouth once daily 03/05/17   [provider]  aspirin EC 81 MG tablet Take 81 mg by mouth daily.    [provider]  atorvastatin (LIPITOR) 10 MG tablet take 1 tablet by mouth once daily 03/05/17   [provider]  ofloxacin (OCUFLOX) 0.3 % ophthalmic solution instill 1 drop into both  eyes four times a day 06/13/17   [provider]  timolol (TIMOPTIC) 0.25 % ophthalmic solution  06/03/17   [provider]   Physical Exam: Vitals:   08/30/21 1032 08/30/21 1033 08/30/21 1430  BP: 132/87  (!) 150/85  Pulse: (!) 108  98  Resp: (!) 22  (!) 30  Temp: 98.6 F (37 C)    TempSrc: Oral    SpO2: 91%  98%  Weight:  56.7 kg   Height:  5\' 8"  (1.727 m)    Constitutional: appears age-appropriate, cachectic, frail, NAD, calm, comfortable Eyes: PERRL, lids and conjunctivae normal ENMT: Mucous membranes are moist. Posterior pharynx clear of any exudate or lesions. Age-appropriate dentition. Hearing appropriate Neck: normal, supple, no masses, no thyromegaly Respiratory: clear to auscultation bilaterally, no wheezing, no crackles. Normal respiratory effort. No accessory muscle use.  Cardiovascular: Regular rate and rhythm, no murmurs / rubs / gallops. No extremity edema. 2+ pedal pulses. No carotid bruits.  Abdomen: Scaphoid abdomen, no tenderness, no masses palpated, no hepatosplenomegaly. Bowel sounds positive.  Musculoskeletal: no clubbing / cyanosis. No joint deformity upper and lower extremities. Good ROM, no contractures, no atrophy. Normal muscle tone.  Skin: no rashes, lesions, ulcers. No induration Neurologic: Sensation intact. Strength 5/5 in all 4.  Psychiatric: Normal judgment and insight. Alert and oriented x 3. Normal mood.   EKG: independently reviewed, showing sinus tachycardia with rate of 108,  PAC, QTC of 458  Chest x-ray on Admission: I personally reviewed and I agree with radiologist reading as below.  DG Chest 2 View  Addendum Date: 08/30/2021   ADDENDUM REPORT: 08/30/2021 15:42 ADDENDUM: Correction: Original findings correct. IMPRESSION: Large "RIGHT" unilateral pleural effusion. Common differential would include parapneumonic effusion versus malignancy. Consider CT thorax with contrast for further characterization. Electronically Signed   By:  Suzy Bouchard M.D.   On: 08/30/2021 15:42   Addendum Date: 08/30/2021   ADDENDUM REPORT: 08/30/2021 15:07 ADDENDUM: Correction in the findings section, "there is a large LEFT pleural effusion which layers dependent " Electronically Signed   By: Suzy Bouchard M.D.   On: 08/30/2021 15:07   Result Date: 08/30/2021 CLINICAL DATA:  Short of breath EXAM: CHEST - 2 VIEW COMPARISON:  None FINDINGS: Normal cardiac silhouette. Bilateral scarring within lungs greater on the RIGHT. There is a large RIGHT pleural effusion which layers dependently. IMPRESSION: Large LEFT unilateral pleural effusion. Common differential would include parapneumonic effusion versus malignancy. Consider CT thorax with contrast for further characterization. Electronically Signed: By: Suzy Bouchard M.D. On: 08/30/2021 11:12    Labs on Admission: I have personally reviewed following labs  CBC: Recent Labs  Lab 08/30/21 1035  WBC 18.2*  HGB 13.3  HCT 41.1  MCV 87.1  PLT 244*   Basic Metabolic Panel: Recent Labs  Lab 08/30/21 1035  NA 141  K 4.6  CL 100  CO2 30  GLUCOSE 123*  BUN 35*  CREATININE 0.89  CALCIUM 9.5   GFR: Estimated Creatinine Clearance: 55.7 mL/min (by C-G formula based on SCr of 0.89 mg/dL).  Dr. Tobie Poet [facility]  If 7PM-7AM, please contact overnight-coverage provider If 7AM-7PM, please contact day coverage provider [email or web address]  08/30/2021, 4:22 PM

## 2021-08-30 NOTE — Progress Notes (Signed)
   08/30/21 2316  Assess: MEWS Score  Temp 98.5 F (36.9 C)  BP (!) 141/70  Pulse Rate (!) 101  Resp (!) 23  SpO2 93 %  O2 Device Nasal Cannula  O2 Flow Rate (L/min) 4 L/min  Assess: MEWS Score  MEWS Temp 0  MEWS Systolic 0  MEWS Pulse 1  MEWS RR 1  MEWS LOC 0  MEWS Score 2  MEWS Score Color Yellow  Assess: if the MEWS score is Yellow or Red  Were vital signs taken at a resting state? Yes  Focused Assessment No change from prior assessment  Does the patient meet 2 or more of the SIRS criteria? Yes  Does the patient have a confirmed or suspected source of infection? No  MEWS guidelines implemented *See Row Information* Yes  Treat  MEWS Interventions Other (Comment) (follow mews protocol)  Pain Scale 0-10  Pain Score 0  Take Vital Signs  Increase Vital Sign Frequency  Yellow: Q 2hr X 2 then Q 4hr X 2, if remains yellow, continue Q 4hrs  Escalate  MEWS: Escalate Yellow: discuss with charge nurse/RN and consider discussing with provider and RRT  Notify: Charge Nurse/RN  Name of Charge Nurse/RN Notified Jackie,RN  Date Charge Nurse/RN Notified 08/30/21  Time Charge Nurse/RN Notified 2330  Assess: SIRS CRITERIA  SIRS Temperature  0  SIRS Pulse 1  SIRS Respirations  1  SIRS WBC 0  SIRS Score Sum  2

## 2021-08-30 NOTE — ED Triage Notes (Signed)
Pt comes into the ED via POV with his family c/o cough, SHOB, and increased weakness.  Pt's sister states that he hasnt eaten in a couple of days and his breathing has gotten worse over the past couple days.  Pt does present using his accessory muscles to breathe at this time.  Pt denies any known respiratory diagnoses.  Pt in is alert and oriented x4 at this time.

## 2021-08-30 NOTE — ED Notes (Signed)
ED TO INPATIENT HANDOFF REPORT  ED Nurse Name and Phone #: [phone]  S Name/Age/Gender Cody Williams 80 y.o. male Room/Bed: ED24A/ED24A  Code Status   Code Status: Full Code  Home/SNF/Other Home Patient oriented to: self, place, time, and situation Is this baseline? Yes   Triage Complete: Triage complete  Chief Complaint Pleural effusion on right [J90]  Triage Note Pt comes into the ED via POV with his family c/o cough, SHOB, and increased weakness.  Pt's sister states that he hasnt eaten in a couple of days and his breathing has gotten worse over the past couple days.  Pt does present using his accessory muscles to breathe at this time.  Pt denies any known respiratory diagnoses.  Pt in is alert and oriented x4 at this time.    Allergies No Known Allergies  Level of Care/Admitting Diagnosis ED Disposition     ED Disposition  Admit   Condition  --   [place]: [facility] [place] [[id number]]  Level of Care: Med-Surg [16]  Covid Evaluation: Asymptomatic Screening Protocol (No Symptoms)  Diagnosis: Pleural effusion on right [102725]  Admitting Physician: Criss Alvine [[id number]]  Attending Physician: COX, AMY N [id number]          B Medical/Surgery History Past Medical History:  Diagnosis Date   Glaucoma    Hyperlipidemia    Hypertension    Past Surgical History:  Procedure Laterality Date   EYE SURGERY       A IV Location/Drains/Wounds Patient Lines/Drains/Airways Status     Active Line/Drains/Airways     Name Placement date Placement time Site Days   Peripheral IV 08/30/21 22 G Anterior;Distal;Right Forearm 08/30/21  1452  Forearm  less than 1   Peripheral IV 08/30/21 20 G 1.88" Left Antecubital 08/30/21  1549  Antecubital  less than 1            Intake/Output Last 24 hours No intake or output data in the 24 hours ending 08/30/21 2137  Labs/Imaging Results for orders placed or performed during the hospital encounter of  08/30/21 (from the past 48 hour(s))  Basic metabolic panel     Status: Abnormal   Collection Time: 08/30/21 10:35 AM  Result Value Ref Range   Sodium 141 135 - 145 mmol/L   Potassium 4.6 3.5 - 5.1 mmol/L   Chloride 100 98 - 111 mmol/L   CO2 30 22 - 32 mmol/L   Glucose, Bld 123 (H) 70 - 99 mg/dL    Comment: Glucose reference range applies only to samples taken after fasting for at least 8 hours.   BUN 35 (H) 8 - 23 mg/dL   Creatinine, Ser 0.89 0.61 - 1.24 mg/dL   Calcium 9.5 8.9 - 10.3 mg/dL   GFR, Estimated >60 >60 mL/min    Comment: (NOTE) Calculated using the CKD-EPI Creatinine Equation (2021)    Anion gap 11 5 - 15    Comment: Performed at [facility], [address] [place], [place] [address]  CBC     Status: Abnormal   Collection Time: 08/30/21 10:35 AM  Result Value Ref Range   WBC 18.2 (H) 4.0 - 10.5 K/uL   RBC 4.72 4.22 - 5.81 MIL/uL   Hemoglobin 13.3 13.0 - 17.0 g/dL   HCT 41.1 39.0 - 52.0 %   MCV 87.1 80.0 - 100.0 fL   MCH 28.2 26.0 - 34.0 pg   MCHC 32.4 30.0 - 36.0 g/dL   RDW 13.6 11.5 -  15.5 %   Platelets 713 (H) 150 - 400 K/uL   nRBC 0.0 0.0 - 0.2 %    Comment: Performed at [facility], [address] [place], [place] [address]  Resp Panel by RT-PCR (Flu A&B, Covid) Nasopharyngeal Swab     Status: None   Collection Time: 08/30/21  2:52 PM   Specimen: Nasopharyngeal Swab; Nasopharyngeal(NP) swabs in vial transport medium  Result Value Ref Range   SARS Coronavirus 2 by RT PCR NEGATIVE NEGATIVE    Comment: (NOTE) SARS-CoV-2 target nucleic acids are NOT DETECTED.  The SARS-CoV-2 RNA is generally detectable in upper respiratory specimens during the acute phase of infection. The lowest concentration of SARS-CoV-2 viral copies this assay can detect is 138 copies/mL. A negative result does not preclude SARS-Cov-2 infection and should not be used as the sole basis for treatment or other patient management decisions. A negative result may occur  with  improper specimen collection/handling, submission of specimen other than nasopharyngeal swab, presence of viral mutation(s) within the areas targeted by this assay, and inadequate number of viral copies(<138 copies/mL). A negative result must be combined with clinical observations, patient history, and epidemiological information. The expected result is Negative.  Fact Sheet for Patients:  EntrepreneurPulse.com.au  Fact Sheet for Healthcare Providers:  IncredibleEmployment.be  This test is no t yet approved or cleared by the [place] FDA and  has been authorized for detection and/or diagnosis of SARS-CoV-2 by FDA under an Emergency Use Authorization (EUA). This EUA will remain  in effect (meaning this test can be used) for the duration of the COVID-19 declaration under Section 564(b)(1) of the Act, 21 U.S.C.section 360bbb-3(b)(1), unless the authorization is terminated  or revoked sooner.       Influenza A by PCR NEGATIVE NEGATIVE   Influenza B by PCR NEGATIVE NEGATIVE    Comment: (NOTE) The Xpert Xpress SARS-CoV-2/FLU/RSV plus assay is intended as an aid in the diagnosis of influenza from Nasopharyngeal swab specimens and should not be used as a sole basis for treatment. Nasal washings and aspirates are unacceptable for Xpert Xpress SARS-CoV-2/FLU/RSV testing.  Fact Sheet for Patients: EntrepreneurPulse.com.au  Fact Sheet for Healthcare Providers: IncredibleEmployment.be  This test is not yet approved or cleared by the [place] FDA and has been authorized for detection and/or diagnosis of SARS-CoV-2 by FDA under an Emergency Use Authorization (EUA). This EUA will remain in effect (meaning this test can be used) for the duration of the COVID-19 declaration under Section 564(b)(1) of the Act, 21 U.S.C. section 360bbb-3(b)(1), unless the authorization is terminated or revoked.  Performed  at [facility], [place], [place] [address]   Troponin I (High Sensitivity)     Status: Abnormal   Collection Time: 08/30/21  3:37 PM  Result Value Ref Range   Troponin I (High Sensitivity) 24 (H) <18 ng/L    Comment: (NOTE) Elevated high sensitivity troponin I (hsTnI) values and significant  changes across serial measurements may suggest ACS but many other  chronic and acute conditions are known to elevate hsTnI results.  Refer to the "Links" section for chest pain algorithms and additional  guidance. Performed at [facility], [address] [place], [place] [address]   Brain natriuretic peptide     Status: None   Collection Time: 08/30/21  3:37 PM  Result Value Ref Range   B Natriuretic Peptide 85.0 0.0 - 100.0 pg/mL    Comment: Performed at [facility], [address],  [place], [place] [address]  Procalcitonin - Baseline     Status: None   Collection Time: 08/30/21  3:37 PM  Result Value Ref Range   Procalcitonin 0.10 ng/mL    Comment:        Interpretation: PCT (Procalcitonin) <= 0.5 ng/mL: Systemic infection (sepsis) is not likely. Local bacterial infection is possible. (NOTE)       Sepsis PCT Algorithm           Lower Respiratory Tract                                      Infection PCT Algorithm    ----------------------------     ----------------------------         PCT < 0.25 ng/mL                PCT < 0.10 ng/mL          Strongly encourage             Strongly discourage   discontinuation of antibiotics    initiation of antibiotics    ----------------------------     -----------------------------       PCT 0.25 - 0.50 ng/mL            PCT 0.10 - 0.25 ng/mL               OR       >80% decrease in PCT            Discourage initiation of                                            antibiotics      Encourage discontinuation           of antibiotics    ----------------------------     -----------------------------          PCT >= 0.50 ng/mL              PCT 0.26 - 0.50 ng/mL               AND        <80% decrease in PCT             Encourage initiation of                                             antibiotics       Encourage continuation           of antibiotics    ----------------------------     -----------------------------        PCT >= 0.50 ng/mL                  PCT > 0.50 ng/mL               AND         increase in PCT                  Strongly encourage  initiation of antibiotics    Strongly encourage escalation           of antibiotics                                     -----------------------------                                           PCT <= 0.25 ng/mL                                                 OR                                        > 80% decrease in PCT                                      Discontinue / Do not initiate                                             antibiotics  Performed at [facility], [address] [place], [place] [address]   Troponin I (High Sensitivity)     Status: Abnormal   Collection Time: 08/30/21  5:53 PM  Result Value Ref Range   Troponin I (High Sensitivity) 64 (H) <18 ng/L    Comment: CRITICAL RESULT CALLED TO, READ BACK BY AND VERIFIED WITH MAGGIE TERRAY 08/30/21 1910 MU (NOTE) Elevated high sensitivity troponin I (hsTnI) values and significant  changes across serial measurements may suggest ACS but many other  chronic and acute conditions are known to elevate hsTnI results.  Refer to the "Links" section for chest pain algorithms and additional  guidance. Performed at [facility], [address] [place], [place] [address]    DG Chest 2 View  Addendum Date: 08/30/2021   ADDENDUM REPORT: 08/30/2021 15:42 ADDENDUM: Correction: Original findings correct. IMPRESSION: Large "RIGHT" unilateral pleural effusion. Common differential would include parapneumonic effusion versus malignancy.  Consider CT thorax with contrast for further characterization. Electronically Signed   By: Suzy Bouchard M.D.   On: 08/30/2021 15:42   Addendum Date: 08/30/2021   ADDENDUM REPORT: 08/30/2021 15:07 ADDENDUM: Correction in the findings section, "there is a large LEFT pleural effusion which layers dependent " Electronically Signed   By: Suzy Bouchard M.D.   On: 08/30/2021 15:07   Result Date: 08/30/2021 CLINICAL DATA:  Short of breath EXAM: CHEST - 2 VIEW COMPARISON:  None FINDINGS: Normal cardiac silhouette. Bilateral scarring within lungs greater on the RIGHT. There is a large RIGHT pleural effusion which layers dependently. IMPRESSION: Large LEFT unilateral pleural effusion. Common differential would include parapneumonic effusion versus malignancy. Consider CT thorax with contrast for further characterization. Electronically Signed: By: Suzy Bouchard M.D. On: 08/30/2021 11:12    Pending Labs [employer] (From admission, onward)     Start  Ordered   08/31/21 1610  Basic metabolic panel  Tomorrow morning,   STAT        08/30/21 1440   08/31/21 0500  CBC  Tomorrow morning,   STAT        08/30/21 1440   08/31/21 0500  Phosphorus  Tomorrow morning,   STAT        08/30/21 1440   08/31/21 0500  Magnesium  Tomorrow morning,   STAT        08/30/21 1440   08/31/21 0500  Procalcitonin  Daily,   STAT      08/30/21 1501   08/30/21 1422  Blood culture (routine x 2)  BLOOD CULTURE X 2,   STAT      08/30/21 1422   08/30/21 1034  Urinalysis, Routine w reflex microscopic  Once,   STAT        08/30/21 1034            Vitals/Pain Today's Vitals   08/30/21 1930 08/30/21 2000 08/30/21 2030 08/30/21 2100  BP: 129/60 122/69 109/82 114/76  Pulse: 100 96 (!) 102 98  Resp: (!) 23 (!) 24 (!) 29 (!) 28  Temp:      TempSrc:      SpO2: 91% 97% 91% 97%  Weight:      Height:      PainSc:        Isolation Precautions No active isolations  Medications Medications  atorvastatin  (LIPITOR) tablet 10 mg (has no administration in time range)  acetaminophen (TYLENOL) tablet 650 mg (has no administration in time range)    Or  acetaminophen (TYLENOL) suppository 650 mg (has no administration in time range)  ondansetron (ZOFRAN) tablet 4 mg (has no administration in time range)    Or  ondansetron (ZOFRAN) injection 4 mg (has no administration in time range)  amLODipine (NORVASC) tablet 5 mg (has no administration in time range)  dorzolamide-timolol (COSOPT) 22.3-6.8 MG/ML ophthalmic solution 1 drop (has no administration in time range)  nicotine (NICODERM CQ - dosed in mg/24 hours) patch 21 mg (has no administration in time range)  heparin injection 5,000 Units (has no administration in time range)  cefTRIAXone (ROCEPHIN) 2 g in sodium chloride 0.9 % 100 mL IVPB (has no administration in time range)  azithromycin (ZITHROMAX) 500 mg in sodium chloride 0.9 % 250 mL IVPB (has no administration in time range)  azithromycin (ZITHROMAX) 500 mg in sodium chloride 0.9 % 250 mL IVPB (0 mg Intravenous Stopped 08/30/21 1737)  cefTRIAXone (ROCEPHIN) 1 g in sodium chloride 0.9 % 100 mL IVPB (0 g Intravenous Stopped 08/30/21 1619)    Mobility walks Low fall risk   Focused Assessments Pulmonary Assessment Handoff:  Lung sounds:   O2 Device: Nasal Cannula O2 Flow Rate (L/min): 4 L/min    R Recommendations: See Admitting Provider Note  Report given to:   Additional Notes:  US guided thoracentesis and ? CTA in AM /

## 2021-08-31 ENCOUNTER — Observation Stay: Payer: Medicare HMO

## 2021-08-31 ENCOUNTER — Encounter: Payer: Self-pay | Admitting: Internal Medicine

## 2021-08-31 ENCOUNTER — Inpatient Hospital Stay: Payer: Medicare HMO

## 2021-08-31 DIAGNOSIS — R6510 Systemic inflammatory response syndrome (SIRS) of non-infectious origin without acute organ dysfunction: Secondary | ICD-10-CM | POA: Diagnosis present

## 2021-08-31 DIAGNOSIS — H409 Unspecified glaucoma: Secondary | ICD-10-CM | POA: Diagnosis present

## 2021-08-31 DIAGNOSIS — Z20822 Contact with and (suspected) exposure to covid-19: Secondary | ICD-10-CM | POA: Diagnosis present

## 2021-08-31 DIAGNOSIS — J439 Emphysema, unspecified: Secondary | ICD-10-CM | POA: Diagnosis present

## 2021-08-31 DIAGNOSIS — Z515 Encounter for palliative care: Secondary | ICD-10-CM | POA: Diagnosis not present

## 2021-08-31 DIAGNOSIS — Z7982 Long term (current) use of aspirin: Secondary | ICD-10-CM | POA: Diagnosis not present

## 2021-08-31 DIAGNOSIS — F17210 Nicotine dependence, cigarettes, uncomplicated: Secondary | ICD-10-CM | POA: Diagnosis present

## 2021-08-31 DIAGNOSIS — J90 Pleural effusion, not elsewhere classified: Secondary | ICD-10-CM | POA: Diagnosis not present

## 2021-08-31 DIAGNOSIS — J849 Interstitial pulmonary disease, unspecified: Secondary | ICD-10-CM | POA: Diagnosis present

## 2021-08-31 DIAGNOSIS — C782 Secondary malignant neoplasm of pleura: Secondary | ICD-10-CM | POA: Diagnosis present

## 2021-08-31 DIAGNOSIS — Z681 Body mass index (BMI) 19 or less, adult: Secondary | ICD-10-CM | POA: Diagnosis not present

## 2021-08-31 DIAGNOSIS — E43 Unspecified severe protein-calorie malnutrition: Secondary | ICD-10-CM | POA: Diagnosis present

## 2021-08-31 DIAGNOSIS — E785 Hyperlipidemia, unspecified: Secondary | ICD-10-CM | POA: Diagnosis present

## 2021-08-31 DIAGNOSIS — J9601 Acute respiratory failure with hypoxia: Secondary | ICD-10-CM | POA: Diagnosis present

## 2021-08-31 DIAGNOSIS — Z79899 Other long term (current) drug therapy: Secondary | ICD-10-CM | POA: Diagnosis not present

## 2021-08-31 DIAGNOSIS — J910 Malignant pleural effusion: Secondary | ICD-10-CM | POA: Diagnosis present

## 2021-08-31 DIAGNOSIS — Z48813 Encounter for surgical aftercare following surgery on the respiratory system: Secondary | ICD-10-CM | POA: Diagnosis not present

## 2021-08-31 DIAGNOSIS — R5381 Other malaise: Secondary | ICD-10-CM | POA: Diagnosis present

## 2021-08-31 DIAGNOSIS — I10 Essential (primary) hypertension: Secondary | ICD-10-CM | POA: Diagnosis present

## 2021-08-31 DIAGNOSIS — H5461 Unqualified visual loss, right eye, normal vision left eye: Secondary | ICD-10-CM | POA: Diagnosis present

## 2021-08-31 LAB — BODY FLUID CELL COUNT WITH DIFFERENTIAL
Lymphs, Fluid: 74 %
Monocyte-Macrophage-Serous Fluid: 14 %
Neutrophil Count, Fluid: 12 %
Total Nucleated Cell Count, Fluid: 6340 cu mm

## 2021-08-31 LAB — ALBUMIN: Albumin: 2.2 g/dL — ABNORMAL LOW (ref 3.5–5.0)

## 2021-08-31 LAB — CBC
HCT: 39.7 % (ref 39.0–52.0)
Hemoglobin: 12.6 g/dL — ABNORMAL LOW (ref 13.0–17.0)
MCH: 28.8 pg (ref 26.0–34.0)
MCHC: 31.7 g/dL (ref 30.0–36.0)
MCV: 90.6 fL (ref 80.0–100.0)
Platelets: 577 10*3/uL — ABNORMAL HIGH (ref 150–400)
RBC: 4.38 MIL/uL (ref 4.22–5.81)
RDW: 13.9 % (ref 11.5–15.5)
WBC: 17.8 10*3/uL — ABNORMAL HIGH (ref 4.0–10.5)
nRBC: 0 % (ref 0.0–0.2)

## 2021-08-31 LAB — BASIC METABOLIC PANEL
Anion gap: 10 (ref 5–15)
BUN: 29 mg/dL — ABNORMAL HIGH (ref 8–23)
CO2: 29 mmol/L (ref 22–32)
Calcium: 9.2 mg/dL (ref 8.9–10.3)
Chloride: 102 mmol/L (ref 98–111)
Creatinine, Ser: 0.89 mg/dL (ref 0.61–1.24)
GFR, Estimated: >60 mL/min (ref >60)
Glucose, Bld: 89 mg/dL (ref 70–99)
Potassium: 4.1 mmol/L (ref 3.5–5.1)
Sodium: 141 mmol/L (ref 135–145)

## 2021-08-31 LAB — PROTEIN, PLEURAL OR PERITONEAL FLUID: Total protein, fluid: 4.3 g/dL

## 2021-08-31 LAB — PROCALCITONIN: Procalcitonin: 0.1 ng/mL

## 2021-08-31 LAB — PHOSPHORUS: Phosphorus: 4 mg/dL (ref 2.5–4.6)

## 2021-08-31 LAB — LACTATE DEHYDROGENASE, PLEURAL OR PERITONEAL FLUID: LD, Fluid: 2171 U/L — ABNORMAL HIGH (ref 3–23)

## 2021-08-31 LAB — MAGNESIUM: Magnesium: 2.2 mg/dL (ref 1.7–2.4)

## 2021-08-31 MED ORDER — IOHEXOL 300 MG/ML  SOLN
75.0000 mL | Freq: Once | INTRAMUSCULAR | Status: AC | PRN
  Administered 2021-08-31: 13:00:00 75 mL via INTRAVENOUS

## 2021-08-31 MED ORDER — MOMETASONE FURO-FORMOTEROL FUM 200-5 MCG/ACT IN AERO
2.0000 | INHALATION_SPRAY | Freq: Two times a day (BID) | RESPIRATORY_TRACT | Status: DC
  Administered 2021-08-31 – 2021-09-03 (×6): 2 via RESPIRATORY_TRACT
  Filled 2021-08-31 (×2): qty 8.8

## 2021-08-31 MED ORDER — ENSURE ENLIVE PO LIQD
237.0000 mL | Freq: Three times a day (TID) | ORAL | Status: DC
  Administered 2021-08-31 – 2021-09-03 (×8): 237 mL via ORAL

## 2021-08-31 MED ORDER — TIOTROPIUM BROMIDE MONOHYDRATE 18 MCG IN CAPS
18.0000 ug | ORAL_CAPSULE | Freq: Every day | RESPIRATORY_TRACT | Status: DC
  Administered 2021-08-31 – 2021-09-03 (×4): 18 ug via RESPIRATORY_TRACT
  Filled 2021-08-31 (×2): qty 5

## 2021-08-31 MED ORDER — ADULT MULTIVITAMIN W/MINERALS CH
1.0000 | ORAL_TABLET | Freq: Every day | ORAL | Status: DC
  Administered 2021-08-31 – 2021-09-03 (×4): 1 via ORAL
  Filled 2021-08-31 (×4): qty 1

## 2021-08-31 NOTE — Progress Notes (Signed)
PROGRESS NOTE    Cody Williams  YWV:[id number] DOB: 1941-04-11 DOA: 08/30/2021 PCP: Baxter Hire, MD   Chief Complain: Cough, shortness of breath  Brief Narrative: Patient is a 80 year old male with history of tobacco use, hypertension, hyperlipidemia, glaucoma with right eye blindness who presented with shortness of breath, cough from home.  He lives with his brother.  Ambulatory at baseline.  He has reported weight loss since last few weeks.  On presentation he was mildly tachycardic, afebrile, normotensive.  He was saturating 91% on room air.  Chest x-ray revealed large right-sided pleural effusion.  He was also given a dose of ceftriaxone, azithromycin in the ED.  He was admitted for the management of right-sided pleural effusion with suspicion of malignancy.  Ultrasound-guided thoracentesis has been requested.  Assessment & Plan:   Principal Problem:   Pleural effusion on right Active Problems:   Hypertension   Hyperlipidemia   Carotid stenosis   Current tobacco use   Leukocytosis   SIRS due to infectious process with acute organ dysfunction (HCC)   Sepsis (HCC)   Right-sided pleural effusion: Work-up in progress.  Highly suspicious for malignancy given history of tobacco use.  US guided thoracentesis has been requested .  We will follow-up on pleural fluid analysis. Will order CT chest with contrast after thoracentesis.  Acute respiratory failure with hypoxia: Desaturated on room air on presentation.  Currently on 3 to 4 L of oxygen per minute.  Patient looks tachypneic.  He coughs.  Does not use oxygen at home.  Continue supplemental oxygen as needed.  Suspicion for superimposed pneumonia, SIRS: Presented with tachypnea, tachycardia, leukocytosis.  Follow-up blood cultures.  Started on ceftriaxone at admission.  Hyperlipidemia: On Lipitor 10 mg daily  Hypertension: On amlodipine 5 mg daily.  This morning blood pressure is elevated.  We will continue to monitor blood  pressure  Debility/deconditioning/malnutrition: Looks extremely deconditioned, malnourished.  History of significant weight loss.  We will request evaluation consult.  We will do PT/OT assessment after thoracentesis          DVT prophylaxis:Heparin Liborio Negron Torres Code Status: Full Family Communication: None at beside Status is: Observation    Consultants: None  Procedures:None  Antimicrobials:  Anti-infectives (From admission, onward)    Start     Dose/Rate Route Frequency Ordered Stop   08/31/21 1600  cefTRIAXone (ROCEPHIN) 2 g in sodium chloride 0.9 % 100 mL IVPB        2 g 200 mL/hr over 30 Minutes Intravenous Every 24 hours 08/30/21 1543 09/04/21 1559   08/31/21 1000  azithromycin (ZITHROMAX) 500 mg in sodium chloride 0.9 % 250 mL IVPB        500 mg 250 mL/hr over 60 Minutes Intravenous Every 24 hours 08/30/21 1628 09/02/21 0959   08/30/21 1600  cefTRIAXone (ROCEPHIN) 2 g in sodium chloride 0.9 % 100 mL IVPB  Status:  Discontinued        2 g 200 mL/hr over 30 Minutes Intravenous Every 24 hours 08/30/21 1502 08/30/21 1543   08/30/21 1545  cefTRIAXone (ROCEPHIN) 1 g in sodium chloride 0.9 % 100 mL IVPB        1 g 200 mL/hr over 30 Minutes Intravenous  Once 08/30/21 1543 08/30/21 1619   08/30/21 1430  cefTRIAXone (ROCEPHIN) 1 g in sodium chloride 0.9 % 100 mL IVPB  Status:  Discontinued        1 g 200 mL/hr over 30 Minutes Intravenous  Once 08/30/21 1422 08/30/21 1532  08/30/21 1430  azithromycin (ZITHROMAX) 500 mg in sodium chloride 0.9 % 250 mL IVPB        500 mg 250 mL/hr over 60 Minutes Intravenous  Once 08/30/21 1422 08/30/21 1737       Subjective: Patient seen and examined the bedside this morning.  Hemodynamically stable.  On 3 to 4 days of oxygen per minute.  Looks tachypneic.  Denies any shortness of breath at rest.  Objective: Vitals:   08/31/21 0110 08/31/21 0305 08/31/21 0729 08/31/21 0745  BP: (!) 142/59 (!) 152/70 (!) 139/57 112/73  Pulse: 100 100 (!) 105 (!)  103  Resp: (!) 24 (!) 24 (!) 25 20  Temp: 98.1 F (36.7 C) 98.5 F (36.9 C) 98.8 F (37.1 C) 98.8 F (37.1 C)  TempSrc: Oral Oral Oral   SpO2: 96% 100% 95% 100%  Weight:      Height:       No intake or output data in the 24 hours ending 08/31/21 0829 Filed Weights   08/30/21 1033  Weight: 56.7 kg    Examination:  General exam: Debilitated, deconditioned, malnourished, cachectic HEENT: PERRL Respiratory system: No crackles or wheezes, diminished air sounds bilaterally, more on the right Cardiovascular system: Sinus tachycardia Gastrointestinal system: Abdomen is nondistended, soft and nontender. Central nervous system: Alert and oriented Extremities: No edema, no clubbing ,no cyanosis Skin: No rashes, no ulcers,no icterus      Data Reviewed: I have personally reviewed following labs and imaging studies  CBC: Recent Labs  Lab 08/30/21 1035 08/31/21 0029  WBC 18.2* 17.8*  HGB 13.3 12.6*  HCT 41.1 39.7  MCV 87.1 90.6  PLT 713* 706*   Basic Metabolic Panel: Recent Labs  Lab 08/30/21 1035 08/31/21 0029  NA 141 141  K 4.6 4.1  CL 100 102  CO2 30 29  GLUCOSE 123* 89  BUN 35* 29*  CREATININE 0.89 0.89  CALCIUM 9.5 9.2  MG  --  2.2  PHOS  --  4.0   GFR: Estimated Creatinine Clearance: 55.7 mL/min (by C-G formula based on SCr of 0.89 mg/dL). Liver Function Tests: No results for input(s): AST, ALT, ALKPHOS, BILITOT, PROT, ALBUMIN in the last 168 hours. No results for input(s): LIPASE, AMYLASE in the last 168 hours. No results for input(s): AMMONIA in the last 168 hours. Coagulation Profile: No results for input(s): INR, PROTIME in the last 168 hours. Cardiac Enzymes: No results for input(s): CKTOTAL, CKMB, CKMBINDEX, TROPONINI in the last 168 hours. BNP (last 3 results) No results for input(s): PROBNP in the last 8760 hours. HbA1C: No results for input(s): HGBA1C in the last 72 hours. CBG: No results for input(s): GLUCAP in the last 168 hours. Lipid  Profile: No results for input(s): CHOL, HDL, LDLCALC, TRIG, CHOLHDL, LDLDIRECT in the last 72 hours. Thyroid Function Tests: No results for input(s): TSH, T4TOTAL, FREET4, T3FREE, THYROIDAB in the last 72 hours. Anemia Panel: No results for input(s): VITAMINB12, FOLATE, FERRITIN, TIBC, IRON, RETICCTPCT in the last 72 hours. Sepsis Labs: Recent Labs  Lab 08/30/21 1537 08/31/21 0029  PROCALCITON 0.10 0.10    Recent Results (from the past 240 hour(s))  Resp Panel by RT-PCR (Flu A&B, Covid) Nasopharyngeal Swab     Status: None   Collection Time: 08/30/21  2:52 PM   Specimen: Nasopharyngeal Swab; Nasopharyngeal(NP) swabs in vial transport medium  Result Value Ref Range Status   SARS Coronavirus 2 by RT PCR NEGATIVE NEGATIVE Final    Comment: (NOTE) SARS-CoV-2 target nucleic acids are  NOT DETECTED.  The SARS-CoV-2 RNA is generally detectable in upper respiratory specimens during the acute phase of infection. The lowest concentration of SARS-CoV-2 viral copies this assay can detect is 138 copies/mL. A negative result does not preclude SARS-Cov-2 infection and should not be used as the sole basis for treatment or other patient management decisions. A negative result may occur with  improper specimen collection/handling, submission of specimen other than nasopharyngeal swab, presence of viral mutation(s) within the areas targeted by this assay, and inadequate number of viral copies(<138 copies/mL). A negative result must be combined with clinical observations, patient history, and epidemiological information. The expected result is Negative.  Fact Sheet for Patients:  EntrepreneurPulse.com.au  Fact Sheet for Healthcare Providers:  IncredibleEmployment.be  This test is no t yet approved or cleared by the [place] FDA and  has been authorized for detection and/or diagnosis of SARS-CoV-2 by FDA under an Emergency Use Authorization (EUA). This  EUA will remain  in effect (meaning this test can be used) for the duration of the COVID-19 declaration under Section 564(b)(1) of the Act, 21 U.S.C.section 360bbb-3(b)(1), unless the authorization is terminated  or revoked sooner.       Influenza A by PCR NEGATIVE NEGATIVE Final   Influenza B by PCR NEGATIVE NEGATIVE Final    Comment: (NOTE) The Xpert Xpress SARS-CoV-2/FLU/RSV plus assay is intended as an aid in the diagnosis of influenza from Nasopharyngeal swab specimens and should not be used as a sole basis for treatment. Nasal washings and aspirates are unacceptable for Xpert Xpress SARS-CoV-2/FLU/RSV testing.  Fact Sheet for Patients: EntrepreneurPulse.com.au  Fact Sheet for Healthcare Providers: IncredibleEmployment.be  This test is not yet approved or cleared by the [place] FDA and has been authorized for detection and/or diagnosis of SARS-CoV-2 by FDA under an Emergency Use Authorization (EUA). This EUA will remain in effect (meaning this test can be used) for the duration of the COVID-19 declaration under Section 564(b)(1) of the Act, 21 U.S.C. section 360bbb-3(b)(1), unless the authorization is terminated or revoked.  Performed at [facility], [address] [place], [place] [address]   Blood culture (routine x 2)     Status: None (Preliminary result)   Collection Time: 08/30/21  3:37 PM   Specimen: BLOOD  Result Value Ref Range Status   Specimen Description BLOOD LEFT ANTECUBITAL  Final   Special Requests   Final    AEROBIC BOTTLE ONLY Blood Culture results may not be optimal due to an inadequate volume of blood received in culture bottles   Culture   Final    NO GROWTH < 24 HOURS Performed at [facility], [address], [place], [place] [address]    Report Status PENDING  Incomplete  Culture, blood (Routine X 2) w Reflex to ID Panel     Status: None (Preliminary result)   Collection Time:  08/31/21 12:29 AM   Specimen: BLOOD  Result Value Ref Range Status   Specimen Description BLOOD LEFT HAND  Final   Special Requests   Final    BOTTLES DRAWN AEROBIC ONLY Blood Culture results may not be optimal due to an inadequate volume of blood received in culture bottles   Culture   Final    NO GROWTH < 12 HOURS Performed at [facility], [address] [place], [place] [address]    Report Status PENDING  Incomplete         Radiology Studies: DG Chest 2 View  Addendum Date: 08/30/2021  ADDENDUM REPORT: 08/30/2021 15:42 ADDENDUM: Correction: Original findings correct. IMPRESSION: Large "RIGHT" unilateral pleural effusion. Common differential would include parapneumonic effusion versus malignancy. Consider CT thorax with contrast for further characterization. Electronically Signed   By: Suzy Bouchard M.D.   On: 08/30/2021 15:42   Addendum Date: 08/30/2021   ADDENDUM REPORT: 08/30/2021 15:07 ADDENDUM: Correction in the findings section, "there is a large LEFT pleural effusion which layers dependent " Electronically Signed   By: Suzy Bouchard M.D.   On: 08/30/2021 15:07   Result Date: 08/30/2021 CLINICAL DATA:  Short of breath EXAM: CHEST - 2 VIEW COMPARISON:  None FINDINGS: Normal cardiac silhouette. Bilateral scarring within lungs greater on the RIGHT. There is a large RIGHT pleural effusion which layers dependently. IMPRESSION: Large LEFT unilateral pleural effusion. Common differential would include parapneumonic effusion versus malignancy. Consider CT thorax with contrast for further characterization. Electronically Signed: By: Suzy Bouchard M.D. On: 08/30/2021 11:12        Scheduled Meds:  amLODipine  5 mg Oral Daily   atorvastatin  10 mg Oral QHS   dorzolamide-timolol  1 drop Both Eyes BID   heparin  5,000 Units Subcutaneous Q8H   Continuous Infusions:  azithromycin     cefTRIAXone (ROCEPHIN)  IV       LOS: 0 days    Time spent:35 mins. More  than 50% of that time was spent in counseling and/or coordination of care.      Shelly Coss, MD [facility] P11/12/2020, 8:29 AM

## 2021-08-31 NOTE — Discharge Instructions (Signed)

## 2021-08-31 NOTE — Consult Note (Signed)
[facility] Consultation      Date: 08/31/2021,   MRN# [id number] DAESHAUN SPECHT [birth year]    AdmissionWeight: 56.7 kg                 CurrentWeight: 56.7 kg EVEN BUDLONG is a 80 y.o. old male seen in consultation for abnormal CT CHEST at the request of Adhikari.     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 Glaucoma   . Hyperlipidemia   . Hypertension      SURGICAL HISTORY   Past Surgical History:  Procedure Laterality Date  . EYE SURGERY       FAMILY HISTORY   Family History  Problem Relation Age of Onset  . Varicose Veins Neg Hx   . Vision loss Neg Hx      SOCIAL HISTORY   Social History   Tobacco Use  . Smoking status: Former    Types: Cigarettes    Quit date: 04/15/2018    Years since quitting: 3.3  . Smokeless tobacco: Never  Substance Use Topics  . Alcohol use: Yes  . Drug use: No     MEDICATIONS    Home Medication:    Current Medication:  Current Facility-Administered Medications:  .  acetaminophen (TYLENOL) tablet 650 mg, 650 mg, Oral, Q6H PRN **OR** acetaminophen (TYLENOL) suppository 650 mg, 650 mg, Rectal, Q6H PRN, Cox, Amy N, DO .  amLODipine (NORVASC) tablet 5 mg, 5 mg, Oral, Daily, Cox, Amy N, DO, 5 mg at 08/31/21 0929 .  atorvastatin (LIPITOR)  tablet 10 mg, 10 mg, Oral, QHS, Cox, Amy N, DO, 10 mg at 08/30/21 2336 .  azithromycin (ZITHROMAX) 500 mg in sodium chloride 0.9 % 250 mL IVPB, 500 mg, Intravenous, Q24H, Cox, Amy N, DO, Last Rate: 250 mL/hr at 08/31/21 0934, 500 mg at 08/31/21 0934 .  cefTRIAXone (ROCEPHIN) 2 g in sodium chloride 0.9 % 100 mL IVPB, 2 g, Intravenous, Q24H, Cox, Amy N, DO, Last Rate: 200 mL/hr at 08/31/21 1517, 2 g at 08/31/21 1517 .  dorzolamide-timolol (COSOPT) 22.3-6.8 MG/ML ophthalmic solution 1 drop, 1 drop, Both Eyes, BID, Cox, Amy N, DO, 1 drop at 08/31/21 1023 .  feeding supplement (ENSURE ENLIVE / ENSURE PLUS) liquid 237 mL, 237 mL, Oral, TID BM, Adhikari, Amrit, MD, 237 mL at 08/31/21 1459 .  heparin injection 5,000 Units, 5,000 Units, Subcutaneous, Q8H, Cox, Amy N, DO, 5,000 Units at 08/31/21 1459 .  multivitamin with minerals tablet 1 tablet, 1 tablet, Oral, Daily, Shelly Coss, MD, 1 tablet at 08/31/21 1459 .  nicotine (NICODERM CQ - dosed in mg/24 hours) patch 21 mg, 21 mg, Transdermal, Daily PRN, Cox, Amy N, DO .  ondansetron (ZOFRAN) tablet 4 mg, 4 mg, Oral, Q6H PRN **OR** ondansetron (ZOFRAN) injection 4 mg, 4 mg, Intravenous,  Q6H PRN, Cox, Amy N, DO    ALLERGIES   Patient has no known allergies.     REVIEW OF SYSTEMS    Review of Systems:  Gen:  Denies  fever, sweats, chills +weigh loss  HEENT: Denies blurred vision   Cardiac:  No dizziness, chest pain or heaviness, chest tightness,edema Resp:   +cough +shortness of breath,-wheezing, -hemoptysis,  Gi: Denies swallowing difficulty, stomach pain, nausea or vomiting, diarrhea, constipation, bowel incontinence Gu:  Denies bladder incontinence, burning urine Ext:   Denies Joint pain, stiffness or swelling Skin: Denies  skin rash, easy bruising or bleeding or hives Endoc:  Denies polyuria, polydipsia , polyphagia or weight change Psych:   Denies depression, insomnia or hallucinations   Other:  All other systems negative   VS: BP  130/75 (BP Location: Left Arm)   Pulse 96   Temp 98.8 F (37.1 C)   Resp 16   Ht 5\' 8"  (1.727 m)   Wt 56.7 kg   SpO2 98%   BMI 19.01 kg/m      PHYSICAL EXAM  Physical Examination:   General Appearance: No distress  EYES PERRLA, EOM intact.   NECK Supple, No JVD Pulmonary: normal breath sounds, No wheezing.  CardiovascularNormal S1,S2.  No m/r/g.   Abdomen: Benign, Soft, non-tender. Skin:   warm, no rashes, no ecchymosis  Extremities: normal, no cyanosis, clubbing. Neuro:without focal findings,  speech normal  PSYCHIATRIC: Mood, affect within normal limits.   ALL OTHER ROS ARE NEGATIVE     IMAGING    DG Chest 2 View  Addendum Date: 08/30/2021   ADDENDUM REPORT: 08/30/2021 15:42 ADDENDUM: Correction: Original findings correct. IMPRESSION: Large "RIGHT" unilateral pleural effusion. Common differential would include parapneumonic effusion versus malignancy. Consider CT thorax with contrast for further characterization. Electronically Signed   By: Suzy Bouchard M.D.   On: 08/30/2021 15:42   Addendum Date: 08/30/2021   ADDENDUM REPORT: 08/30/2021 15:07 ADDENDUM: Correction in the findings section, "there is a large LEFT pleural effusion which layers dependent " Electronically Signed   By: Suzy Bouchard M.D.   On: 08/30/2021 15:07   Result Date: 08/30/2021 CLINICAL DATA:  Short of breath EXAM: CHEST - 2 VIEW COMPARISON:  None FINDINGS: Normal cardiac silhouette. Bilateral scarring within lungs greater on the RIGHT. There is a large RIGHT pleural effusion which layers dependently. IMPRESSION: Large LEFT unilateral pleural effusion. Common differential would include parapneumonic effusion versus malignancy. Consider CT thorax with contrast for further characterization. Electronically Signed: By: Suzy Bouchard M.D. On: 08/30/2021 11:12   CT CHEST W CONTRAST  Result Date: 08/31/2021 CLINICAL DATA:  80 year old male with history of tobacco use, hypertension, hyperlipidemia,  glaucoma with right eye blindness who presented with shortness of breath, cough from home. He lives with his brother. Ambulatory at baseline. He has reported weight loss since last few weeks. On presentation he was mildly tachycardic, afebrile, normotensive. He was saturating 91% on room air. Chest x-ray revealed large right-sided pleural effusion. He was also given a dose of ceftriaxone, azithromycin in the ED. He was admitted for the management of right-sided pleural effusion with suspicion of malignancy. EXAM: CT CHEST WITH CONTRAST TECHNIQUE: Multidetector CT imaging of the chest was performed during intravenous contrast administration. CONTRAST:  36mL OMNIPAQUE IOHEXOL 300 MG/ML  SOLN COMPARISON:  Chest radiograph 08/31/2021 FINDINGS: Cardiovascular: Heart size is within normal limits. Atherosclerotic calcifications seen throughout the thoracic aorta. No pulmonary artery embolism is identified. Mediastinum/Nodes: Mildly enlarged right supraclavicular lymph node is seen measuring  1.4 cm in short axis. Enlarged mediastinal lymph nodes are seen with large is located in the right paratracheal region measuring 1.4 cm in short axis. Enlarged subcarinal lymph node is present measuring 1.4 cm in short axis as well. There are enlarged lymph nodes in the right hilar region consistent with metastatic disease. Lungs/Pleura: Masslike thickening of the pleura of the right medial upper lung with direct extension into the mediastinum measuring approximately 4.1 x 3.5 x 2.9 cm is consistent with malignancy. He a spiculated mass is also seen in the right upper lobe on image 36 of series 3 which measures 1.9 x 1.4 x 1.0 cm. There are advanced emphysematous changes of the lungs. Minimal residual right pleural effusion. Bandlike opacity at the left lung apex likely due to scarring. Upper Abdomen: No acute abnormality. Musculoskeletal: No chest wall abnormality. No acute or significant osseous findings. IMPRESSION: 1. Spiculated  mass in the right upper lobe measuring 1.9 x 1.4 x 1.0 cm is suspicious for primary malignancy. Additional soft tissue mass of the pleura along the medial right upper lobe with direct extension into the mediastinum may be due to metastatic disease or additional site of primary pleural malignancy. 2. 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 Aortic Atherosclerosis (ICD10-I70.0) and Emphysema (ICD10-J43.9). Electronically Signed   By: Miachel Roux M.D.   On: 08/31/2021 13:39   DG Chest Port 1 View  Result Date: 08/31/2021 CLINICAL DATA:  Post right thoracentesis EXAM: PORTABLE CHEST 1 VIEW COMPARISON:  08/30/2021 FINDINGS: Decreased right pleural effusion which is now small. Airspace disease is present throughout the right lung. No pneumothorax Hyperinflation left lung. Apical linear density is unchanged likely scarring. Left lung base clear. No left effusion IMPRESSION: No complication post right thoracentesis. Electronically Signed   By: Franchot Gallo M.D.   On: 08/31/2021 12:32   VAS US CAROTID  Result Date: 08/15/2021 Carotid Arterial Duplex Study Patient Name:  CADEN FUKUSHIMA  Date of Exam:   08/04/2021 Medical Rec #: [id number]        Accession #:    [id number] Date of Birth: 1941/01/18       Patient Gender: M Patient Age:   45 years Exam Location:  [facility] Procedure:      VAS US CAROTID Referring Phys: Leotis Pain --------------------------------------------------------------------------------  Indications: Carotid artery disease. Performing Technologist: Blondell Reveal RT, RDMS, RVT  Examination Guidelines: A complete evaluation includes B-mode imaging, spectral Doppler, color Doppler, and power Doppler as needed of all accessible portions of each vessel. Bilateral testing is considered an integral part of a complete examination. Limited examinations for reoccurring indications may be performed as noted.  Right Carotid Findings:  +----------+--------+--------+--------+------------------+--------+           PSV cm/sEDV cm/sStenosisPlaque DescriptionComments +----------+--------+--------+--------+------------------+--------+ CCA Prox  123     10                                         +----------+--------+--------+--------+------------------+--------+ CCA Mid   107     14                                         +----------+--------+--------+--------+------------------+--------+ CCA Distal59      13  heterogenous               +----------+--------+--------+--------+------------------+--------+ ICA                       Occludedheterogenous               +----------+--------+--------+--------+------------------+--------+ ECA       114     12                                         +----------+--------+--------+--------+------------------+--------+ +----------+--------+-------+----------------+-------------------+           PSV cm/sEDV cmsDescribe        Arm Pressure (mmHG) +----------+--------+-------+----------------+-------------------+ [id number]            Multiphasic, WNL                    +----------+--------+-------+----------------+-------------------+ +---------+--------+--+--------+---------+ VertebralPSV cm/s81EDV cm/sAntegrade +---------+--------+--+--------+---------+  Left Carotid Findings: +----------+--------+--------+--------+------------------+--------+           PSV cm/sEDV cm/sStenosisPlaque DescriptionComments +----------+--------+--------+--------+------------------+--------+ CCA Prox  146     30                                         +----------+--------+--------+--------+------------------+--------+ CCA Mid   138     29                                         +----------+--------+--------+--------+------------------+--------+ CCA Distal112     23                                          +----------+--------+--------+--------+------------------+--------+ ICA Prox  134     36      1-39%   calcific                   +----------+--------+--------+--------+------------------+--------+ ICA Mid   84      31                                         +----------+--------+--------+--------+------------------+--------+ ICA Distal95      32                                         +----------+--------+--------+--------+------------------+--------+ ECA       114     16                                         +----------+--------+--------+--------+------------------+--------+ +----------+--------+--------+----------------+-------------------+           PSV cm/sEDV cm/sDescribe        Arm Pressure (mmHG) +----------+--------+--------+----------------+-------------------+ [id number]             Multiphasic, WNL                    +----------+--------+--------+----------------+-------------------+ +---------+--------+--+--------+---------+ VertebralPSV cm/s39EDV cm/sAntegrade +---------+--------+--+--------+---------+  Summary: 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 Left Carotid: Velocities in the left ICA are consistent with a 1-39% stenosis.               The extracranial vessels were near-normal with only minimal wall               thickening or plaque. No significant change noted when compared to the previous exam on 08/05/20. *See table(s) above for measurements and observations.  Electronically signed by Leotis Pain MD on 08/15/2021 at 9:06:59 AM.    Final    US THORACENTESIS ASP PLEURAL SPACE W/IMG GUIDE  Result Date: 08/31/2021 CLINICAL DATA:  Right pleural effusion. EXAM: ULTRASOUND GUIDED RIGHT THORACENTESIS COMPARISON:  None. PROCEDURE: An ultrasound guided thoracentesis was thoroughly discussed with the patient and questions answered. The benefits, risks, alternatives and complications were also discussed. The patient understands and wishes to  proceed with the procedure. Written consent was obtained. Ultrasound was performed to localize and mark an adequate pocket of fluid in the right chest. The area was then prepped and draped in the normal sterile fashion. 1% Lidocaine was used for local anesthesia. Under ultrasound guidance a 6 French Safe-T-Centesis catheter was introduced. Thoracentesis was performed. The catheter was removed and a dressing applied. COMPLICATIONS: None FINDINGS: A total of approximately 1.5 L of bloody fluid was removed. A fluid sample was sent for laboratory analysis. IMPRESSION: Successful ultrasound guided right thoracentesis yielding 1.5 L of pleural fluid. Electronically Signed   By: Aletta Edouard M.D.   On: 08/31/2021 13:18      ASSESSMENT/PLAN   80 yo AAM with abnormal CT chest with newly diagnosis of COPD with RT sided pleural effusion with RT Lung spiculated mass with exudative pleural effusion  Overall prognosis is grim   The Risks and Benefits of the Bronchoscopy procedure with NAV were explained to patient/family.  I have discussed the risk for Acute Bleeding, increased chance of Infection, increased chance of Respiratory Failure and Cardiac Arrest, increased chance of pneumothorax and collapsed lung, as well as increased Stroke and Death.  This will  be re-addressed as outpatient  Follow up Cytology reports Start COPD inhaler therapy Oxygen as needed Recommend Palliative care consultation Follow up as outpatient for assessment for [facility]    Patient/Family are satisfied with Plan of action and management. All questions answered  Corrin Parker, M.D.  [facility]  Medical Director Joaquin Director [facility]

## 2021-08-31 NOTE — ED Provider Notes (Signed)
[facility] Emergency Department Provider Note   ____________________________________________   Event Date/Time   First MD Initiated Contact with Patient 08/30/21 1414     (approximate)  I have reviewed the triage vital signs and the nursing notes.   HISTORY  Chief Complaint Shortness of Breath and Weakness    HPI Cody Williams is a 80 y.o. male who presents for shortness of breath  LOCATION: Chest DURATION: 1 week prior to arrival TIMING: Worsening since onset SEVERITY: Severe QUALITY: Shortness of breath CONTEXT: Patient states that over the last week he has had worsening shortness of breath MODIFYING FACTORS: Exertion worsens the shortness of breath and is partially relieved at rest and with supplemental oxygen ASSOCIATED SYMPTOMS: Mild chest pressure   Per medical record review, patient has history of tobacco abuse, hypertension, hyperlipidemia          Past Medical History:  Diagnosis Date   Glaucoma    Hyperlipidemia    Hypertension     Patient Active Problem List   Diagnosis Date Noted   Pleural effusion, right 08/31/2021   Protein-calorie malnutrition, severe 08/31/2021   Pleural effusion on right 08/30/2021   Leukocytosis 08/30/2021   SIRS due to infectious process with acute organ dysfunction ([place]) 08/30/2021   Sepsis ([place]) 08/30/2021   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 Authorizing Provider  amLODipine (NORVASC) 5 MG tablet take 1 tablet by mouth once daily 03/05/17  Yes [provider]  aspirin EC 81 MG tablet Take 81 mg by mouth daily.   Yes [provider]  atorvastatin (LIPITOR) 10 MG tablet take 1 tablet by mouth once  daily 03/05/17  Yes [provider]  dorzolamide-timolol (COSOPT) 22.3-6.8 MG/ML ophthalmic solution 1 drop 2 (two) times daily. 06/23/21  Yes [provider]  ofloxacin (OCUFLOX) 0.3 % ophthalmic solution instill 1 drop into both eyes four times a day Patient not taking: No sig reported 06/13/17   [provider]  timolol (TIMOPTIC) 0.25 % ophthalmic solution  06/03/17   [provider]    Allergies Patient has no known allergies.  Family History  Problem Relation Age of Onset   Varicose Veins Neg Hx    Vision loss Neg Hx     Social History Social History   Tobacco Use   Smoking status: Former    Types: Cigarettes    Quit date: 04/15/2018    Years since quitting: 3.3   Smokeless tobacco: Never  Substance Use Topics   Alcohol use: Yes   Drug use: No    Review of Systems Constitutional: No fever/chills Eyes: No visual changes. ENT: No sore throat. Cardiovascular: Endorses chest pain. Respiratory: Endorses shortness of breath. Gastrointestinal: No abdominal pain.  No nausea, no vomiting.  No diarrhea. Genitourinary: Negative for dysuria. Musculoskeletal: Negative for acute arthralgias Skin: Negative for rash. Neurological: Negative for headaches, weakness/numbness/paresthesias in any extremity Psychiatric: Negative for suicidal ideation/homicidal ideation   ____________________________________________   PHYSICAL EXAM:  VITAL SIGNS: ED Triage Vitals  Enc Vitals Group     BP 08/30/21 1032 132/87     Pulse Rate 08/30/21 1032 (!) 108     Resp 08/30/21 1032 (!) 22  Temp 08/30/21 1032 98.6 F (37 C)     Temp Source 08/30/21 1032 Oral     SpO2 08/30/21 1032 91 %     Weight 08/30/21 1033 125 lb (56.7 kg)     Height 08/30/21 1033 5\' 8"  (1.727 m)     Head Circumference --      Peak Flow --      Pain Score 08/30/21 1032 0     Pain Loc --      Pain Edu? --      Excl. in [place]? --    Constitutional: Alert and oriented. Well appearing  and in no acute distress. Eyes: Conjunctivae are normal. PERRL. Head: Atraumatic. Nose: No congestion/rhinnorhea. Mouth/Throat: Mucous membranes are moist. Neck: No stridor Cardiovascular: Grossly normal heart sounds.  Good peripheral circulation. Respiratory: Decreased breath sounds over right lower lung fields.  Increased respiratory effort.  No retractions. Gastrointestinal: Soft and nontender. No distention. Musculoskeletal: No obvious deformities Neurologic:  Normal speech and language. No gross focal neurologic deficits are appreciated. Skin:  Skin is warm and dry. No rash noted. Psychiatric: Mood and affect are normal. Speech and behavior are normal.  ____________________________________________   LABS (all labs ordered are listed, but only abnormal results are displayed)  Labs Reviewed  BASIC METABOLIC PANEL - Abnormal; Notable for the following components:      Result Value   Glucose, Bld 123 (*)    BUN 35 (*)    All other components within normal limits  CBC - Abnormal; Notable for the following components:   WBC 18.2 (*)    Platelets 713 (*)    All other components within normal limits  URINALYSIS, ROUTINE W REFLEX MICROSCOPIC - Abnormal; Notable for the following components:   Color, Urine YELLOW (*)    APPearance HAZY (*)    Ketones, ur 5 (*)    Protein, ur 30 (*)    All other components within normal limits  BASIC METABOLIC PANEL - Abnormal; Notable for the following components:   BUN 29 (*)    All other components within normal limits  CBC - Abnormal; Notable for the following components:   WBC 17.8 (*)    Hemoglobin 12.6 (*)    Platelets 577 (*)    All other components within normal limits  LACTATE DEHYDROGENASE, PLEURAL OR PERITONEAL FLUID - Abnormal; Notable for the following components:   LD, Fluid 2,171 (*)    All other components within normal limits  BODY FLUID CELL COUNT WITH DIFFERENTIAL - Abnormal; Notable for the following components:   Color,  Fluid RED (*)    Appearance, Fluid CLOUDY (*)    All other components within normal limits  ALBUMIN - Abnormal; Notable for the following components:   Albumin 2.2 (*)    All other components within normal limits  TROPONIN I (HIGH SENSITIVITY) - Abnormal; Notable for the following components:   Troponin I (High Sensitivity) 24 (*)    All other components within normal limits  TROPONIN I (HIGH SENSITIVITY) - Abnormal; Notable for the following components:   Troponin I (High Sensitivity) 64 (*)    All other components within normal limits  RESP PANEL BY RT-PCR (FLU A&B, COVID) [id number]  CULTURE, BLOOD (ROUTINE X 2)  CULTURE, BLOOD (ROUTINE X 2) W REFLEX TO ID PANEL  BODY FLUID CULTURE W GRAM STAIN  BRAIN NATRIURETIC PEPTIDE  PROCALCITONIN  PHOSPHORUS  MAGNESIUM  PROCALCITONIN  PROTEIN, PLEURAL OR PERITONEAL FLUID  MISC [facility] TEST (SEND OUT)  CBG MONITORING,  ED  CYTOLOGY - NON PAP   ____________________________________________  EKG  ED ECG REPORT I, Naaman Plummer, the attending physician, personally viewed and interpreted this ECG.  Date: 08/31/2021 EKG Time: 1040 Rate: 108 Rhythm: Tachycardic sinus rhythm QRS Axis: normal Intervals: normal ST/T Wave abnormalities: normal Narrative Interpretation: Tachycardic sinus rhythm.  No evidence of acute ischemia  ____________________________________________  RADIOLOGY  ED MD interpretation: Single view portable chest x-ray shows new right-sided pleural effusion  Official radiology report(s): CT CHEST W CONTRAST  Result Date: 08/31/2021 CLINICAL DATA:  80 year old male with history of tobacco use, hypertension, hyperlipidemia, glaucoma with right eye blindness who presented with shortness of breath, cough from home. He lives with his brother. Ambulatory at baseline. He has reported weight loss since last few weeks. On presentation he was mildly tachycardic, afebrile, normotensive. He was saturating 91% on room air. Chest x-ray  revealed large right-sided pleural effusion. He was also given a dose of ceftriaxone, azithromycin in the ED. He was admitted for the management of right-sided pleural effusion with suspicion of malignancy. EXAM: CT CHEST WITH CONTRAST TECHNIQUE: Multidetector CT imaging of the chest was performed during intravenous contrast administration. CONTRAST:  25mL OMNIPAQUE IOHEXOL 300 MG/ML  SOLN COMPARISON:  Chest radiograph 08/31/2021 FINDINGS: Cardiovascular: Heart size is within normal limits. Atherosclerotic calcifications seen throughout the thoracic aorta. No pulmonary artery embolism is identified. Mediastinum/Nodes: Mildly enlarged right supraclavicular lymph node is seen measuring 1.4 cm in short axis. Enlarged mediastinal lymph nodes are seen with large is located in the right paratracheal region measuring 1.4 cm in short axis. Enlarged subcarinal lymph node is present measuring 1.4 cm in short axis as well. There are enlarged lymph nodes in the right hilar region consistent with metastatic disease. Lungs/Pleura: Masslike thickening of the pleura of the right medial upper lung with direct extension into the mediastinum measuring approximately 4.1 x 3.5 x 2.9 cm is consistent with malignancy. He a spiculated mass is also seen in the right upper lobe on image 36 of series 3 which measures 1.9 x 1.4 x 1.0 cm. There are advanced emphysematous changes of the lungs. Minimal residual right pleural effusion. Bandlike opacity at the left lung apex likely due to scarring. Upper Abdomen: No acute abnormality. Musculoskeletal: No chest wall abnormality. No acute or significant osseous findings. IMPRESSION: 1. Spiculated mass in the right upper lobe measuring 1.9 x 1.4 x 1.0 cm is suspicious for primary malignancy. Additional soft tissue mass of the pleura along the medial right upper lobe with direct extension into the mediastinum may be due to metastatic disease or additional site of primary pleural malignancy. 2. Right  hilar, mediastinal, and right neck lymphadenopathy consistent with metastatic disease. Aortic Atherosclerosis (ICD10-I70.0) and Emphysema (ICD10-J43.9). Electronically Signed   By: Miachel Roux M.D.   On: 08/31/2021 13:39   DG Chest Port 1 View  Result Date: 08/31/2021 CLINICAL DATA:  Post right thoracentesis EXAM: PORTABLE CHEST 1 VIEW COMPARISON:  08/30/2021 FINDINGS: Decreased right pleural effusion which is now small. Airspace disease is present throughout the right lung. No pneumothorax Hyperinflation left lung. Apical linear density is unchanged likely scarring. Left lung base clear. No left effusion IMPRESSION: No complication post right thoracentesis. Electronically Signed   By: Franchot Gallo M.D.   On: 08/31/2021 12:32   US THORACENTESIS ASP PLEURAL SPACE W/IMG GUIDE  Result Date: 08/31/2021 CLINICAL DATA:  Right pleural effusion. EXAM: ULTRASOUND GUIDED RIGHT THORACENTESIS COMPARISON:  None. PROCEDURE: An ultrasound guided thoracentesis was thoroughly discussed with the patient  and questions answered. The benefits, risks, alternatives and complications were also discussed. The patient understands and wishes to proceed with the procedure. Written consent was obtained. Ultrasound was performed to localize and mark an adequate pocket of fluid in the right chest. The area was then prepped and draped in the normal sterile fashion. 1% Lidocaine was used for local anesthesia. Under ultrasound guidance a 6 French Safe-T-Centesis catheter was introduced. Thoracentesis was performed. The catheter was removed and a dressing applied. COMPLICATIONS: None FINDINGS: A total of approximately 1.5 L of bloody fluid was removed. A fluid sample was sent for laboratory analysis. IMPRESSION: Successful ultrasound guided right thoracentesis yielding 1.5 L of pleural fluid. Electronically Signed   By: Aletta Edouard M.D.   On: 08/31/2021 13:18     ____________________________________________   PROCEDURES  Procedure(s) performed (including Critical Care):  .1-3 Lead EKG Interpretation Performed by: Naaman Plummer, MD Authorized by: Naaman Plummer, MD     Interpretation: normal     ECG rate:  95   ECG rate assessment: normal     Rhythm: sinus rhythm     Ectopy: none     Conduction: normal    CRITICAL CARE Performed by: Naaman Plummer   Total critical care time: 31 minutes  Critical care time was exclusive of separately billable procedures and treating other patients.  Critical care was necessary to treat or prevent imminent or life-threatening deterioration.  Critical care was time spent personally by me on the following activities: development of treatment plan with patient and/or surrogate as well as nursing, discussions with consultants, evaluation of patient's response to treatment, examination of patient, obtaining history from patient or surrogate, ordering and performing treatments and interventions, ordering and review of laboratory studies, ordering and review of radiographic studies, pulse oximetry and re-evaluation of patient's condition.  ____________________________________________   INITIAL IMPRESSION / ASSESSMENT AND PLAN / ED COURSE  As part of my medical decision making, I reviewed the following data within the electronic medical record, if available:  Nursing notes reviewed and incorporated, Labs reviewed, EKG interpreted, Old chart reviewed, Radiograph reviewed and Notes from prior ED visits reviewed and incorporated        Patient is a 80 year old male who presents for shortness of breath worsening over the last week in the setting of heavy tobacco abuse, hypertension, and hyperlipidemia DDx: ACS, PE, pneumothorax, pleural effusion, pneumonia, new onset CHF  Findings: Chest x-ray showing new right large pleural effusion  Given patient is requiring submental oxygenation and a new pleural  effusion with unknown etiology, he will require admission to the internal medicine service after IR drainage of this pleural effusion for diagnosis and therapy  Dispo: Admit to medicine      ____________________________________________   FINAL CLINICAL IMPRESSION(S) / ED DIAGNOSES  Final diagnoses:  Pleural effusion on left  Pleural effusion on right     ED Discharge Orders     None        Note:  This document was prepared using Dragon voice recognition software and may include unintentional dictation errors.    Naaman Plummer, MD 08/31/21 [id number]

## 2021-08-31 NOTE — Procedures (Signed)
Interventional Radiology Procedure Note  Procedure: US guided right thoracentesis  Complications: None  Estimated Blood Loss: None  Findings: 1.5 L of bloody fluid removed from right pleural space. Post CXR pending.  Venetia Night. Kathlene Cote, M.D Pager:  [phone]

## 2021-08-31 NOTE — Progress Notes (Signed)
Initial Nutrition Assessment  DOCUMENTATION CODES:  Severe malnutrition in context of social or environmental circumstances  INTERVENTION:  Add Ensure Plus High Protein po TID, each supplement provides 350 kcal and 20 grams of protein.   Add [employer] Breakfast po TID, each supplement provides 140 kcal and 5 grams of protein.  Add MVI with minerals daily.  Encourage PO intake.  NUTRITION DIAGNOSIS:  Severe Malnutrition related to social / environmental circumstances (suspected limited access to food) as evidenced by severe fat depletion, severe muscle depletion.  GOAL:  Patient will meet greater than or equal to 90% of their needs  MONITOR:  PO intake, Supplement acceptance, Labs, Weight trends, I & O's  REASON FOR ASSESSMENT:  Consult Assessment of nutrition requirement/status  ASSESSMENT:  81 yo male with a PMH of tobacco use, hypertension, hyperlipidemia, glaucoma with right eye blindness, who presents emergency department for chief concerns of shortness of breath and cough. Admitted with pleural effusion on the R.  Spoke with pt at bedside. Pt reports that he is tired, but he has not had an appetite for 2-3 weeks.  RD suspects patient may have limited access to food and resources at home.  He knows he has lost weight, but is unsure of how much or over how long. Per Epic, pt has lost ~7 lbs (5.1%) in the last year, which is not necessarily significant for the time frame.  Recommend adding Ensure TID and [employer] with whole milk, as well as MVI with minerals daily.  In addition, RD attached "Suggestions for Increasing Calories and Protein" handout from the Academy of [employer] Dietetics to patient's discharge summary.  Medications: reviewed  Labs: reviewed    NUTRITION - FOCUSED PHYSICAL EXAM: Flowsheet Row Most Recent Value  Orbital Region Severe depletion  Upper Arm Region Severe depletion  Thoracic and Lumbar Region Severe depletion   Buccal Region Severe depletion  Temple Region Severe depletion  Clavicle Bone Region Severe depletion  Clavicle and Acromion Bone Region Severe depletion  Scapular Bone Region Severe depletion  Dorsal Hand Severe depletion  Patellar Region Severe depletion  Anterior Thigh Region Severe depletion  Posterior Calf Region Severe depletion  Edema (RD Assessment) None  Hair Reviewed  Eyes Reviewed  Mouth Reviewed  Skin Reviewed  Nails Reviewed   Diet Order:   Diet Order             Diet Heart Room service appropriate? Yes; Fluid consistency: Thin  Diet effective now                  EDUCATION NEEDS:  Education needs have been addressed  Skin:  Skin Assessment: Reviewed RN Assessment  Last BM:  08/28/21  Height:  Ht Readings from Last 1 Encounters:  08/30/21 5\' 8"  (1.727 m)   Weight:  Wt Readings from Last 1 Encounters:  08/30/21 56.7 kg   BMI:  Body mass index is 19.01 kg/m.  Estimated Nutritional Needs:  Kcal:  2000-2200 Protein:  75-90 grams Fluid:  >2 L  Derrel Nip, RD, LDN (she/her/hers) Registered Dietitian I Pager #: [phone] After-Hours/Weekend Pager # in [place]

## 2021-09-01 ENCOUNTER — Inpatient Hospital Stay: Payer: Medicare HMO

## 2021-09-01 ENCOUNTER — Encounter: Payer: Self-pay | Admitting: Internal Medicine

## 2021-09-01 DIAGNOSIS — Z515 Encounter for palliative care: Secondary | ICD-10-CM | POA: Diagnosis not present

## 2021-09-01 DIAGNOSIS — J90 Pleural effusion, not elsewhere classified: Secondary | ICD-10-CM | POA: Diagnosis not present

## 2021-09-01 LAB — CBC WITH DIFFERENTIAL/PLATELET
Abs Immature Granulocytes: 0.13 10*3/uL — ABNORMAL HIGH (ref 0.00–0.07)
Basophils Absolute: 0 10*3/uL (ref 0.0–0.1)
Basophils Relative: 0 %
Eosinophils Absolute: 0.1 10*3/uL (ref 0.0–0.5)
Eosinophils Relative: 0 %
HCT: 43.4 % (ref 39.0–52.0)
Hemoglobin: 13.3 g/dL (ref 13.0–17.0)
Immature Granulocytes: 1 %
Lymphocytes Relative: 5 %
Lymphs Abs: 0.8 10*3/uL (ref 0.7–4.0)
MCH: 28.4 pg (ref 26.0–34.0)
MCHC: 30.6 g/dL (ref 30.0–36.0)
MCV: 92.5 fL (ref 80.0–100.0)
Monocytes Absolute: 1.5 10*3/uL — ABNORMAL HIGH (ref 0.1–1.0)
Monocytes Relative: 9 %
Neutro Abs: 14.5 10*3/uL — ABNORMAL HIGH (ref 1.7–7.7)
Neutrophils Relative %: 85 %
Platelets: 582 10*3/uL — ABNORMAL HIGH (ref 150–400)
RBC: 4.69 MIL/uL (ref 4.22–5.81)
RDW: 13.8 % (ref 11.5–15.5)
WBC: 17 10*3/uL — ABNORMAL HIGH (ref 4.0–10.5)
nRBC: 0 % (ref 0.0–0.2)

## 2021-09-01 MED ORDER — IOHEXOL 300 MG/ML  SOLN
75.0000 mL | Freq: Once | INTRAMUSCULAR | Status: AC | PRN
  Administered 2021-09-01: 20:00:00 75 mL via INTRAVENOUS

## 2021-09-01 MED ORDER — GADOBUTROL 1 MMOL/ML IV SOLN
6.0000 mL | Freq: Once | INTRAVENOUS | Status: AC | PRN
  Administered 2021-09-01: 6 mL via INTRAVENOUS

## 2021-09-01 NOTE — Progress Notes (Signed)
PROGRESS NOTE    Cody Williams  IPJ:[id number] DOB: 1941-10-23 DOA: 08/30/2021 PCP: Baxter Hire, MD   Chief Complain: Cough, shortness of breath  Brief Narrative: Patient is a 80 year old male with history of tobacco use, hypertension, hyperlipidemia, glaucoma with right eye blindness who presented with shortness of breath, cough from home.  He lives with his brother.  Ambulatory at baseline.  He has reported weight loss since last few weeks.  On presentation he was mildly tachycardic, afebrile, normotensive.  He was saturating 91% on room air.  Chest x-ray revealed large right-sided pleural effusion.  He was also given a dose of ceftriaxone, azithromycin in the ED.  He was admitted for the management of right-sided pleural effusion with suspicion of malignancy.  Ultrasound-guided thoracentesis was done on 08/31/2021 with removal of 1.5 L of bloody fluid.  Fluid analysis revealed elevated LDH, elevated white cell count.  Cytology and culture pending.  Pulmonology was also consulted who recommend outpatient follow-up for possible bronchoscopy.  Palliative care also consulted for possibility of malignancy with poor performance status  Assessment & Plan:   Principal Problem:   Pleural effusion on right Active Problems:   Hypertension   Hyperlipidemia   Carotid stenosis   Current tobacco use   Leukocytosis   SIRS due to infectious process with acute organ dysfunction (HCC)   Sepsis (HCC)   Pleural effusion, right   Protein-calorie malnutrition, severe   Right-sided pleural effusion/Right lung mass:  Ultrasound-guided thoracentesis was done on 08/31/2021 with removal of 1.5 L of bloody fluid.  Fluid analysis revealed elevated LDH, elevated white cell count.Cytology and culture pending.  CT chest showed spiculated mass in the right upper lobe measuring 1.9 x 1.4 x 1.0 cm suspicious for primary malignancy. Additional soft tissue mass of the pleura along the medial right upper lobe with  direct extension into the mediastinum may be due to metastatic disease or additional site of primary pleural malignancy.Right hilar, mediastinal, and right neck lymphadenopathy consistent with metastatic disease Pulmonology was also consulted who recommend outpatient follow-up for possible bronchoscopy.  Palliative care also consulted for possibility of malignancy with poor performance status.  Oncology aware and will follow up  as an outpatient.  Discussed the case with Dr. Grayland Ormond  Acute respiratory failure with hypoxia/COPD: Desaturated on room air on presentation.  Currently on 2 L of oxygen per minute.  Does not use oxygen at home.  Continue supplemental oxygen as needed.  Most likely needs home oxygen on discharge.  He has been started on inhalers for COPD treatment  Suspicion for superimposed pneumonia, SIRS: Presented with tachypnea, tachycardia, leukocytosis.  Follow-up blood cultures.  Started on ceftriaxone at admission.  Remains afebrile.  Sepsis physiology has improved.  Hyperlipidemia: On Lipitor 10 mg daily  Hypertension: On amlodipine 5 mg daily.  This morning blood pressure is stable. We will continue to monitor blood pressure  Debility/deconditioning/malnutrition: Looks extremely deconditioned, malnourished.  History of significant weight loss.  Nutritionist consulted.  We will do PT/OT assessment    Nutrition Problem: Severe Malnutrition Etiology: social / environmental circumstances (suspected limited access to food)      DVT prophylaxis:Heparin [place] Code Status: Full Family Communication: Sister Dark on phone on 09/01/2021 Status is: Observation    Consultants: None  Procedures:None  Antimicrobials:  Anti-infectives (From admission, onward)    Start     Dose/Rate Route Frequency Ordered Stop   08/31/21 1600  cefTRIAXone (ROCEPHIN) 2 g in sodium chloride 0.9 % 100 mL IVPB  2 g 200 mL/hr over 30 Minutes Intravenous Every 24 hours 08/30/21 1543 09/04/21  1559   08/31/21 1000  azithromycin (ZITHROMAX) 500 mg in sodium chloride 0.9 % 250 mL IVPB        500 mg 250 mL/hr over 60 Minutes Intravenous Every 24 hours 08/30/21 1628 09/02/21 0959   08/30/21 1600  cefTRIAXone (ROCEPHIN) 2 g in sodium chloride 0.9 % 100 mL IVPB  Status:  Discontinued        2 g 200 mL/hr over 30 Minutes Intravenous Every 24 hours 08/30/21 1502 08/30/21 1543   08/30/21 1545  cefTRIAXone (ROCEPHIN) 1 g in sodium chloride 0.9 % 100 mL IVPB        1 g 200 mL/hr over 30 Minutes Intravenous  Once 08/30/21 1543 08/30/21 1619   08/30/21 1430  cefTRIAXone (ROCEPHIN) 1 g in sodium chloride 0.9 % 100 mL IVPB  Status:  Discontinued        1 g 200 mL/hr over 30 Minutes Intravenous  Once 08/30/21 1422 08/30/21 1532   08/30/21 1430  azithromycin (ZITHROMAX) 500 mg in sodium chloride 0.9 % 250 mL IVPB        500 mg 250 mL/hr over 60 Minutes Intravenous  Once 08/30/21 1422 08/30/21 1737       Subjective: Patient seen and examined at the bedside this morning.  He looks better than yesterday.  He feels better.  Denies any worsening cough or shortness of breath.  Still on 2 L of oxygen per minute.  Objective: Vitals:   08/31/21 2043 09/01/21 0044 09/01/21 0611 09/01/21 0725  BP: 101/72 118/63 (!) 146/69 (!) 137/46  Pulse: 98 97 100 100  Resp: 18 20 (!) 21 20  Temp: 98.4 F (36.9 C) 97.8 F (36.6 C) 98.2 F (36.8 C) 98.5 F (36.9 C)  TempSrc: Oral   Oral  SpO2: 96%  94% 92%  Weight:      Height:        Intake/Output Summary (Last 24 hours) at 09/01/2021 0737 Last data filed at 09/01/2021 0047 Gross per 24 hour  Intake 500 ml  Output 770 ml  Net -270 ml   Filed Weights   08/30/21 1033  Weight: 56.7 kg    Examination:  General exam: Very deconditioned, chronically ill looking, cachectic HEENT: PERRL Respiratory system: Mildly diminished air entry in both sides, no wheezes or crackles  Cardiovascular system: S1 & S2 heard, RRR.  Gastrointestinal system: Abdomen  is nondistended, soft and nontender. Central nervous system: Alert and oriented Extremities: No edema, no clubbing ,no cyanosis Skin: No rashes, no ulcers,no icterus        Data Reviewed: I have personally reviewed following labs and imaging studies  CBC: Recent Labs  Lab 08/30/21 1035 08/31/21 0029  WBC 18.2* 17.8*  HGB 13.3 12.6*  HCT 41.1 39.7  MCV 87.1 90.6  PLT 713* 662*   Basic Metabolic Panel: Recent Labs  Lab 08/30/21 1035 08/31/21 0029  NA 141 141  K 4.6 4.1  CL 100 102  CO2 30 29  GLUCOSE 123* 89  BUN 35* 29*  CREATININE 0.89 0.89  CALCIUM 9.5 9.2  MG  --  2.2  PHOS  --  4.0   GFR: Estimated Creatinine Clearance: 55.7 mL/min (by C-G formula based on SCr of 0.89 mg/dL). Liver Function Tests: Recent Labs  Lab 08/31/21 0029  ALBUMIN 2.2*   No results for input(s): LIPASE, AMYLASE in the last 168 hours. No results for input(s): AMMONIA in the  last 168 hours. Coagulation Profile: No results for input(s): INR, PROTIME in the last 168 hours. Cardiac Enzymes: No results for input(s): CKTOTAL, CKMB, CKMBINDEX, TROPONINI in the last 168 hours. BNP (last 3 results) No results for input(s): PROBNP in the last 8760 hours. HbA1C: No results for input(s): HGBA1C in the last 72 hours. CBG: No results for input(s): GLUCAP in the last 168 hours. Lipid Profile: No results for input(s): CHOL, HDL, LDLCALC, TRIG, CHOLHDL, LDLDIRECT in the last 72 hours. Thyroid Function Tests: No results for input(s): TSH, T4TOTAL, FREET4, T3FREE, THYROIDAB in the last 72 hours. Anemia Panel: No results for input(s): VITAMINB12, FOLATE, FERRITIN, TIBC, IRON, RETICCTPCT in the last 72 hours. Sepsis Labs: Recent Labs  Lab 08/30/21 1537 08/31/21 0029  PROCALCITON 0.10 0.10    Recent Results (from the past 240 hour(s))  Resp Panel by RT-PCR (Flu A&B, Covid) Nasopharyngeal Swab     Status: None   Collection Time: 08/30/21  2:52 PM   Specimen: Nasopharyngeal Swab;  Nasopharyngeal(NP) swabs in vial transport medium  Result Value Ref Range Status   SARS Coronavirus 2 by RT PCR NEGATIVE NEGATIVE Final    Comment: (NOTE) SARS-CoV-2 target nucleic acids are NOT DETECTED.  The SARS-CoV-2 RNA is generally detectable in upper respiratory specimens during the acute phase of infection. The lowest concentration of SARS-CoV-2 viral copies this assay can detect is 138 copies/mL. A negative result does not preclude SARS-Cov-2 infection and should not be used as the sole basis for treatment or other patient management decisions. A negative result may occur with  improper specimen collection/handling, submission of specimen other than nasopharyngeal swab, presence of viral mutation(s) within the areas targeted by this assay, and inadequate number of viral copies(<138 copies/mL). A negative result must be combined with clinical observations, patient history, and epidemiological information. The expected result is Negative.  Fact Sheet for Patients:  EntrepreneurPulse.com.au  Fact Sheet for Healthcare Providers:  IncredibleEmployment.be  This test is no t yet approved or cleared by the [place] FDA and  has been authorized for detection and/or diagnosis of SARS-CoV-2 by FDA under an Emergency Use Authorization (EUA). This EUA will remain  in effect (meaning this test can be used) for the duration of the COVID-19 declaration under Section 564(b)(1) of the Act, 21 U.S.C.section 360bbb-3(b)(1), unless the authorization is terminated  or revoked sooner.       Influenza A by PCR NEGATIVE NEGATIVE Final   Influenza B by PCR NEGATIVE NEGATIVE Final    Comment: (NOTE) The Xpert Xpress SARS-CoV-2/FLU/RSV plus assay is intended as an aid in the diagnosis of influenza from Nasopharyngeal swab specimens and should not be used as a sole basis for treatment. Nasal washings and aspirates are unacceptable for Xpert Xpress  SARS-CoV-2/FLU/RSV testing.  Fact Sheet for Patients: EntrepreneurPulse.com.au  Fact Sheet for Healthcare Providers: IncredibleEmployment.be  This test is not yet approved or cleared by the [place] FDA and has been authorized for detection and/or diagnosis of SARS-CoV-2 by FDA under an Emergency Use Authorization (EUA). This EUA will remain in effect (meaning this test can be used) for the duration of the COVID-19 declaration under Section 564(b)(1) of the Act, 21 U.S.C. section 360bbb-3(b)(1), unless the authorization is terminated or revoked.  Performed at [facility], [address] [place], [place] [address]   Blood culture (routine x 2)     Status: None (Preliminary result)   Collection Time: 08/30/21  3:37 PM   Specimen: BLOOD  Result Value Ref Range Status  Specimen Description BLOOD LEFT ANTECUBITAL  Final   Special Requests   Final    AEROBIC BOTTLE ONLY Blood Culture results may not be optimal due to an inadequate volume of blood received in culture bottles   Culture   Final    NO GROWTH 2 DAYS Performed at [facility], [address] [place], [place] [address]    Report Status PENDING  Incomplete  Culture, blood (Routine X 2) w Reflex to ID Panel     Status: None (Preliminary result)   Collection Time: 08/31/21 12:29 AM   Specimen: BLOOD  Result Value Ref Range Status   Specimen Description BLOOD LEFT HAND  Final   Special Requests   Final    BOTTLES DRAWN AEROBIC ONLY Blood Culture results may not be optimal due to an inadequate volume of blood received in culture bottles   Culture   Final    NO GROWTH 1 DAY Performed at [facility], [address] [place], [place] [address]    Report Status PENDING  Incomplete         Radiology Studies: DG Chest 2 View  Addendum Date: 08/30/2021   ADDENDUM REPORT: 08/30/2021 15:42 ADDENDUM: Correction: Original findings correct. IMPRESSION:  Large "RIGHT" unilateral pleural effusion. Common differential would include parapneumonic effusion versus malignancy. Consider CT thorax with contrast for further characterization. Electronically Signed   By: Suzy Bouchard M.D.   On: 08/30/2021 15:42   Addendum Date: 08/30/2021   ADDENDUM REPORT: 08/30/2021 15:07 ADDENDUM: Correction in the findings section, "there is a large LEFT pleural effusion which layers dependent " Electronically Signed   By: Suzy Bouchard M.D.   On: 08/30/2021 15:07   Result Date: 08/30/2021 CLINICAL DATA:  Short of breath EXAM: CHEST - 2 VIEW COMPARISON:  None FINDINGS: Normal cardiac silhouette. Bilateral scarring within lungs greater on the RIGHT. There is a large RIGHT pleural effusion which layers dependently. IMPRESSION: Large LEFT unilateral pleural effusion. Common differential would include parapneumonic effusion versus malignancy. Consider CT thorax with contrast for further characterization. Electronically Signed: By: Suzy Bouchard M.D. On: 08/30/2021 11:12   CT CHEST W CONTRAST  Result Date: 08/31/2021 CLINICAL DATA:  80 year old male with history of tobacco use, hypertension, hyperlipidemia, glaucoma with right eye blindness who presented with shortness of breath, cough from home. He lives with his brother. Ambulatory at baseline. He has reported weight loss since last few weeks. On presentation he was mildly tachycardic, afebrile, normotensive. He was saturating 91% on room air. Chest x-ray revealed large right-sided pleural effusion. He was also given a dose of ceftriaxone, azithromycin in the ED. He was admitted for the management of right-sided pleural effusion with suspicion of malignancy. EXAM: CT CHEST WITH CONTRAST TECHNIQUE: Multidetector CT imaging of the chest was performed during intravenous contrast administration. CONTRAST:  16mL OMNIPAQUE IOHEXOL 300 MG/ML  SOLN COMPARISON:  Chest radiograph 08/31/2021 FINDINGS: Cardiovascular: Heart size is  within normal limits. Atherosclerotic calcifications seen throughout the thoracic aorta. No pulmonary artery embolism is identified. Mediastinum/Nodes: Mildly enlarged right supraclavicular lymph node is seen measuring 1.4 cm in short axis. Enlarged mediastinal lymph nodes are seen with large is located in the right paratracheal region measuring 1.4 cm in short axis. Enlarged subcarinal lymph node is present measuring 1.4 cm in short axis as well. There are enlarged lymph nodes in the right hilar region consistent with metastatic disease. Lungs/Pleura: Masslike thickening of the pleura of the right medial upper lung with direct extension into the mediastinum measuring  approximately 4.1 x 3.5 x 2.9 cm is consistent with malignancy. He a spiculated mass is also seen in the right upper lobe on image 36 of series 3 which measures 1.9 x 1.4 x 1.0 cm. There are advanced emphysematous changes of the lungs. Minimal residual right pleural effusion. Bandlike opacity at the left lung apex likely due to scarring. Upper Abdomen: No acute abnormality. Musculoskeletal: No chest wall abnormality. No acute or significant osseous findings. IMPRESSION: 1. Spiculated mass in the right upper lobe measuring 1.9 x 1.4 x 1.0 cm is suspicious for primary malignancy. Additional soft tissue mass of the pleura along the medial right upper lobe with direct extension into the mediastinum may be due to metastatic disease or additional site of primary pleural malignancy. 2. 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 Aortic Atherosclerosis (ICD10-I70.0) and Emphysema (ICD10-J43.9). Electronically Signed   By: Miachel Roux M.D.   On: 08/31/2021 13:39   DG Chest Port 1 View  Result Date: 08/31/2021 CLINICAL DATA:  Post right thoracentesis EXAM: PORTABLE CHEST 1 VIEW COMPARISON:  08/30/2021 FINDINGS: Decreased right pleural effusion which is now small. Airspace disease is present throughout the  right lung. No pneumothorax Hyperinflation left lung. Apical linear density is unchanged likely scarring. Left lung base clear. No left effusion IMPRESSION: No complication post right thoracentesis. Electronically Signed   By: Franchot Gallo M.D.   On: 08/31/2021 12:32   US THORACENTESIS ASP PLEURAL SPACE W/IMG GUIDE  Result Date: 08/31/2021 CLINICAL DATA:  Right pleural effusion. EXAM: ULTRASOUND GUIDED RIGHT THORACENTESIS COMPARISON:  None. PROCEDURE: An ultrasound guided thoracentesis was thoroughly discussed with the patient and questions answered. The benefits, risks, alternatives and complications were also discussed. The patient understands and wishes to proceed with the procedure. Written consent was obtained. Ultrasound was performed to localize and mark an adequate pocket of fluid in the right chest. The area was then prepped and draped in the normal sterile fashion. 1% Lidocaine was used for local anesthesia. Under ultrasound guidance a 6 French Safe-T-Centesis catheter was introduced. Thoracentesis was performed. The catheter was removed and a dressing applied. COMPLICATIONS: None FINDINGS: A total of approximately 1.5 L of bloody fluid was removed. A fluid sample was sent for laboratory analysis. IMPRESSION: Successful ultrasound guided right thoracentesis yielding 1.5 L of pleural fluid. Electronically Signed   By: Aletta Edouard M.D.   On: 08/31/2021 13:18        Scheduled Meds:  amLODipine  5 mg Oral Daily   atorvastatin  10 mg Oral QHS   dorzolamide-timolol  1 drop Both Eyes BID   feeding supplement  237 mL Oral TID BM   heparin  5,000 Units Subcutaneous Q8H   mometasone-formoterol  2 puff Inhalation BID   multivitamin with minerals  1 tablet Oral Daily   tiotropium  18 mcg Inhalation Daily   Continuous Infusions:  azithromycin Stopped (08/31/21 1034)   cefTRIAXone (ROCEPHIN)  IV Stopped (08/31/21 1547)     LOS: 1 day    Time spent:35 mins. More than 50% of that time was  spent in counseling and/or coordination of care.      Shelly Coss, MD [facility] P11/01/2021, 7:37 AM

## 2021-09-01 NOTE — Consult Note (Addendum)
[place]  Telephone:([phone] [phone]   Name: Cody Williams Date: 09/01/2021 MRN: [id number]  DOB: 1941-07-05  Patient Care Team: Baxter Hire, MD as PCP - General (Internal Medicine)    REASON FOR CONSULTATION: Cody Williams is a 80 y.o. male with multiple medical problems including hypertension, hyperlipidemia, glaucoma with right eye blindness, and chronic tobacco use, who was admitted on 08/30/2021 with shortness of breath.  CT of the chest revealed a right upper lobe spiculated mass with extension into the mediastinum and right hilar/mediastinal/right neck lymphadenopathy concerning for metastatic disease.  Palliative care was consulted up address goals.  SOCIAL HISTORY:     reports that he quit smoking about 3 years ago. He has never used smokeless tobacco. He reports current alcohol use. He reports that he does not use drugs.  Patient is divorced.  He lives at home with a brother.  He has 5 children but says that they are not very involved in his life.  Retired from a [employer] manager  ADVANCE DIRECTIVES:  Not on file  CODE STATUS: Full code  PAST MEDICAL HISTORY: Past Medical History:  Diagnosis Date   Glaucoma    Hyperlipidemia    Hypertension     PAST SURGICAL HISTORY:  Past Surgical History:  Procedure Laterality Date   EYE SURGERY      HEMATOLOGY/ONCOLOGY HISTORY:  Oncology History   No history exists.    ALLERGIES:  has No Known Allergies.  MEDICATIONS:  Current Facility-Administered Medications  Medication Dose Route Frequency Provider Last Rate Last Admin   acetaminophen (TYLENOL) tablet 650 mg  650 mg Oral Q6H PRN Cox, Amy N, DO       Or   acetaminophen (TYLENOL) suppository 650 mg  650 mg Rectal Q6H PRN Cox, Amy N, DO       amLODipine (NORVASC) tablet 5 mg  5 mg Oral Daily Cox, Amy N, DO   5 mg at 09/01/21 0817   atorvastatin (LIPITOR) tablet 10 mg  10  mg Oral QHS Cox, Amy N, DO   10 mg at 08/31/21 2046   cefTRIAXone (ROCEPHIN) 2 g in sodium chloride 0.9 % 100 mL IVPB  2 g Intravenous Q24H Cox, Amy N, DO   Stopped at 08/31/21 1547   dorzolamide-timolol (COSOPT) 22.3-6.8 MG/ML ophthalmic solution 1 drop  1 drop Both Eyes BID Cox, Amy N, DO   1 drop at 09/01/21 0817   feeding supplement (ENSURE ENLIVE / ENSURE PLUS) liquid 237 mL  237 mL Oral TID BM Adhikari, Amrit, MD   237 mL at 09/01/21 0817   heparin injection 5,000 Units  5,000 Units Subcutaneous Q8H Cox, Amy N, DO   5,000 Units at 09/01/21 5597   mometasone-formoterol (DULERA) 200-5 MCG/ACT inhaler 2 puff  2 puff Inhalation BID Flora Lipps, MD   2 puff at 09/01/21 0818   multivitamin with minerals tablet 1 tablet  1 tablet Oral Daily Shelly Coss, MD   1 tablet at 09/01/21 0817   nicotine (NICODERM CQ - dosed in mg/24 hours) patch 21 mg  21 mg Transdermal Daily PRN Cox, Amy N, DO       ondansetron (ZOFRAN) tablet 4 mg  4 mg Oral Q6H PRN Cox, Amy N, DO       Or   ondansetron (ZOFRAN) injection 4 mg  4 mg Intravenous Q6H PRN Cox, Amy N, DO       tiotropium (SPIRIVA) inhalation  capsule ([facility] use ONLY) 18 mcg  18 mcg Inhalation Daily Flora Lipps, MD   18 mcg at 09/01/21 0818    VITAL SIGNS: BP (!) 101/54 (BP Location: Left Wrist)   Pulse 93   Temp (!) 97.3 F (36.3 C) (Oral)   Resp 20   Ht '5\' 8"'  (1.727 m)   Wt 125 lb (56.7 kg)   SpO2 94%   BMI 19.01 kg/m  Filed Weights   08/30/21 1033  Weight: 125 lb (56.7 kg)    Estimated body mass index is 19.01 kg/m as calculated from the following:   Height as of this encounter: '5\' 8"'  (1.727 m).   Weight as of this encounter: 125 lb (56.7 kg).  LABS: CBC:    Component Value Date/Time   WBC 17.0 (H) 09/01/2021 0652   HGB 13.3 09/01/2021 0652   HCT 43.4 09/01/2021 0652   PLT 582 (H) 09/01/2021 0652   MCV 92.5 09/01/2021 0652   NEUTROABS 14.5 (H) 09/01/2021 0652   LYMPHSABS 0.8 09/01/2021 0652   MONOABS 1.5 (H) 09/01/2021 0652    EOSABS 0.1 09/01/2021 0652   BASOSABS 0.0 09/01/2021 0652   Comprehensive Metabolic Panel:    Component Value Date/Time   NA 141 08/31/2021 0029   K 4.1 08/31/2021 0029   CL 102 08/31/2021 0029   CO2 29 08/31/2021 0029   BUN 29 (H) 08/31/2021 0029   CREATININE 0.89 08/31/2021 0029   GLUCOSE 89 08/31/2021 0029   CALCIUM 9.2 08/31/2021 0029   ALBUMIN 2.2 (L) 08/31/2021 0029    RADIOGRAPHIC STUDIES: DG Chest 2 View  Addendum Date: 08/30/2021   ADDENDUM REPORT: 08/30/2021 15:42 ADDENDUM: Correction: Original findings correct. IMPRESSION: Large "RIGHT" unilateral pleural effusion. Common differential would include parapneumonic effusion versus malignancy. Consider CT thorax with contrast for further characterization. Electronically Signed   By: Suzy Bouchard M.D.   On: 08/30/2021 15:42   Addendum Date: 08/30/2021   ADDENDUM REPORT: 08/30/2021 15:07 ADDENDUM: Correction in the findings section, "there is a large LEFT pleural effusion which layers dependent " Electronically Signed   By: Suzy Bouchard M.D.   On: 08/30/2021 15:07   Result Date: 08/30/2021 CLINICAL DATA:  Short of breath EXAM: CHEST - 2 VIEW COMPARISON:  None FINDINGS: Normal cardiac silhouette. Bilateral scarring within lungs greater on the RIGHT. There is a large RIGHT pleural effusion which layers dependently. IMPRESSION: Large LEFT unilateral pleural effusion. Common differential would include parapneumonic effusion versus malignancy. Consider CT thorax with contrast for further characterization. Electronically Signed: By: Suzy Bouchard M.D. On: 08/30/2021 11:12   CT CHEST W CONTRAST  Result Date: 08/31/2021 CLINICAL DATA:  80 year old male with history of tobacco use, hypertension, hyperlipidemia, glaucoma with right eye blindness who presented with shortness of breath, cough from home. He lives with his brother. Ambulatory at baseline. He has reported weight loss since last few weeks. On presentation he was mildly  tachycardic, afebrile, normotensive. He was saturating 91% on room air. Chest x-ray revealed large right-sided pleural effusion. He was also given a dose of ceftriaxone, azithromycin in the ED. He was admitted for the management of right-sided pleural effusion with suspicion of malignancy. EXAM: CT CHEST WITH CONTRAST TECHNIQUE: Multidetector CT imaging of the chest was performed during intravenous contrast administration. CONTRAST:  11m OMNIPAQUE IOHEXOL 300 MG/ML  SOLN COMPARISON:  Chest radiograph 08/31/2021 FINDINGS: Cardiovascular: Heart size is within normal limits. Atherosclerotic calcifications seen throughout the thoracic aorta. No pulmonary artery embolism is identified. Mediastinum/Nodes: Mildly enlarged right supraclavicular lymph  node is seen measuring 1.4 cm in short axis. Enlarged mediastinal lymph nodes are seen with large is located in the right paratracheal region measuring 1.4 cm in short axis. Enlarged subcarinal lymph node is present measuring 1.4 cm in short axis as well. There are enlarged lymph nodes in the right hilar region consistent with metastatic disease. Lungs/Pleura: Masslike thickening of the pleura of the right medial upper lung with direct extension into the mediastinum measuring approximately 4.1 x 3.5 x 2.9 cm is consistent with malignancy. He a spiculated mass is also seen in the right upper lobe on image 36 of series 3 which measures 1.9 x 1.4 x 1.0 cm. There are advanced emphysematous changes of the lungs. Minimal residual right pleural effusion. Bandlike opacity at the left lung apex likely due to scarring. Upper Abdomen: No acute abnormality. Musculoskeletal: No chest wall abnormality. No acute or significant osseous findings. IMPRESSION: 1. Spiculated mass in the right upper lobe measuring 1.9 x 1.4 x 1.0 cm is suspicious for primary malignancy. Additional soft tissue mass of the pleura along the medial right upper lobe with direct extension into the mediastinum may be due  to metastatic disease or additional site of primary pleural malignancy. 2. 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 Aortic Atherosclerosis (ICD10-I70.0) and Emphysema (ICD10-J43.9). Electronically Signed   By: Miachel Roux M.D.   On: 08/31/2021 13:39   DG Chest Port 1 View  Result Date: 08/31/2021 CLINICAL DATA:  Post right thoracentesis EXAM: PORTABLE CHEST 1 VIEW COMPARISON:  08/30/2021 FINDINGS: Decreased right pleural effusion which is now small. Airspace disease is present throughout the right lung. No pneumothorax Hyperinflation left lung. Apical linear density is unchanged likely scarring. Left lung base clear. No left effusion IMPRESSION: No complication post right thoracentesis. Electronically Signed   By: Franchot Gallo M.D.   On: 08/31/2021 12:32   VAS US CAROTID  Result Date: 08/15/2021 Carotid Arterial Duplex Study Patient Name:  GARON MELANDER  Date of Exam:   08/04/2021 Medical Rec #: [id number]        Accession #:    [id number] Date of Birth: August 07, 1941       Patient Gender: M Patient Age:   65 years Exam Location:  [facility] Procedure:      VAS US CAROTID Referring Phys: Leotis Pain --------------------------------------------------------------------------------  Indications: Carotid artery disease. Performing Technologist: Blondell Reveal RT, RDMS, RVT  Examination Guidelines: A complete evaluation includes B-mode imaging, spectral Doppler, color Doppler, and power Doppler as needed of all accessible portions of each vessel. Bilateral testing is considered an integral part of a complete examination. Limited examinations for reoccurring indications may be performed as noted.  Right Carotid Findings: +----------+--------+--------+--------+------------------+--------+           PSV cm/sEDV cm/sStenosisPlaque DescriptionComments +----------+--------+--------+--------+------------------+--------+ CCA Prox  123     10                                          +----------+--------+--------+--------+------------------+--------+ CCA Mid   107     14                                         +----------+--------+--------+--------+------------------+--------+ CCA Distal59      13  heterogenous               +----------+--------+--------+--------+------------------+--------+ ICA                       Occludedheterogenous               +----------+--------+--------+--------+------------------+--------+ ECA       114     12                                         +----------+--------+--------+--------+------------------+--------+ +----------+--------+-------+----------------+-------------------+           PSV cm/sEDV cmsDescribe        Arm Pressure (mmHG) +----------+--------+-------+----------------+-------------------+ [id number]            Multiphasic, WNL                    +----------+--------+-------+----------------+-------------------+ +---------+--------+--+--------+---------+ VertebralPSV cm/s81EDV cm/sAntegrade +---------+--------+--+--------+---------+  Left Carotid Findings: +----------+--------+--------+--------+------------------+--------+           PSV cm/sEDV cm/sStenosisPlaque DescriptionComments +----------+--------+--------+--------+------------------+--------+ CCA Prox  146     30                                         +----------+--------+--------+--------+------------------+--------+ CCA Mid   138     29                                         +----------+--------+--------+--------+------------------+--------+ CCA Distal112     23                                         +----------+--------+--------+--------+------------------+--------+ ICA Prox  134     36      1-39%   calcific                   +----------+--------+--------+--------+------------------+--------+ ICA Mid   84      31                                          +----------+--------+--------+--------+------------------+--------+ ICA Distal95      32                                         +----------+--------+--------+--------+------------------+--------+ ECA       114     16                                         +----------+--------+--------+--------+------------------+--------+ +----------+--------+--------+----------------+-------------------+           PSV cm/sEDV cm/sDescribe        Arm Pressure (mmHG) +----------+--------+--------+----------------+-------------------+ [id number]             Multiphasic, WNL                    +----------+--------+--------+----------------+-------------------+ +---------+--------+--+--------+---------+ VertebralPSV cm/s39EDV cm/sAntegrade +---------+--------+--+--------+---------+  Summary: 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 Left Carotid: Velocities in the left ICA are consistent with a 1-39% stenosis.               The extracranial vessels were near-normal with only minimal wall               thickening or plaque. No significant change noted when compared to the previous exam on 08/05/20. *See table(s) above for measurements and observations.  Electronically signed by Leotis Pain MD on 08/15/2021 at 9:06:59 AM.    Final    US THORACENTESIS ASP PLEURAL SPACE W/IMG GUIDE  Result Date: 08/31/2021 CLINICAL DATA:  Right pleural effusion. EXAM: ULTRASOUND GUIDED RIGHT THORACENTESIS COMPARISON:  None. PROCEDURE: An ultrasound guided thoracentesis was thoroughly discussed with the patient and questions answered. The benefits, risks, alternatives and complications were also discussed. The patient understands and wishes to proceed with the procedure. Written consent was obtained. Ultrasound was performed to localize and mark an adequate pocket of fluid in the right chest. The area was then prepped and draped in the normal sterile fashion. 1% Lidocaine was used for local anesthesia. Under  ultrasound guidance a 6 French Safe-T-Centesis catheter was introduced. Thoracentesis was performed. The catheter was removed and a dressing applied. COMPLICATIONS: None FINDINGS: A total of approximately 1.5 L of bloody fluid was removed. A fluid sample was sent for laboratory analysis. IMPRESSION: Successful ultrasound guided right thoracentesis yielding 1.5 L of pleural fluid. Electronically Signed   By: Aletta Edouard M.D.   On: 08/31/2021 13:18    PERFORMANCE STATUS (ECOG) : 2 - Symptomatic, <50% confined to bed  Review of Systems Unless otherwise noted, a complete review of systems is negative.  Physical Exam General: NAD Pulmonary: Unlabored Extremities: no edema, no joint deformities Skin: no rashes Neurological: Weakness but otherwise nonfocal  IMPRESSION: I met with patient and introduced palliative care services.  I attempted to establish therapeutic rapport.  Patient states that he has been told that there is probable lung cancer and that he would need a biopsy to confirm the diagnosis.  Patient says that he has no experience with cancer in his family.  He verbalized interest in pursuing work-up and would like to discuss possible treatment options with medical oncology.  Plan is likely for outpatient work-up.  Patient states at baseline, he was living with his brother but was independent with all of his own care.  Patient would like to return home at time of discharge.  PT is recommending home health.  Patient denies any acute or distressing symptoms at present.  Patient says that his sister is his healthcare power of attorney.  I requested that he bring in ACP documents to be scanned into the chart.  Patient states that he would want his sister to be involved in any medical decision-making particularly as it relates to future cancer care.  Patient does not think he has a living will.  We discussed CODE STATUS.  He says that he has not really thought about these decisions but  says that he will give them some consideration.  PLAN: -Continue current scope of treatment -Staging workup is pending -Will be happy to follow patient in the clinic  Case and plan discussed with Dr. Grayland Ormond   Time Total: 45 minutes  Visit consisted of counseling and education dealing with the complex and emotionally intense issues of symptom management and palliative care in the setting of serious and potentially life-threatening illness.Greater than 50%  of  this time was spent counseling and coordinating care related to the above assessment and plan.  Signed by: Altha Harm, PhD, NP-C

## 2021-09-01 NOTE — Evaluation (Signed)
Physical Therapy Evaluation Patient Details Name: EMMAUS BRANDI MRN: [id number] DOB: 1940-12-26 Today's Date: 09/01/2021  History of Present Illness  80 year old male with history of tobacco use, hypertension, hyperlipidemia, glaucoma with right eye blindness who presented with shortness of breath, cough from home.  He lives with his brother.  Ambulatory at baseline.  He has reported weight loss since last few weeks.  On presentation he was mildly tachycardic, afebrile, normotensive.  He was saturating 91% on room air.  Chest x-ray revealed large right-sided pleural effusion.  He was also given a dose of ceftriaxone, azithromycin in the ED.  He was admitted for the management of right-sided pleural effusion with suspicion of malignancy.  Ultrasound-guided thoracentesis was done on 08/31/2021 with removal of 1.5 L of bloody fluid.  Clinical Impression  Pt alert, agreeable to treatment, oriented x 4. Pt notes PLOF as complete independence and does not require supprot for ADLs. Pt lives with his brother and will have 24/7 support at home. Pt does demonstrate weakness leading to instability. LOB x 1 while standing in which pt needed to sit back on bed due to fatigue. Pt was able to stand with feet apart after initial LOB ~ 2 minutes and amb without AD. Further amb deferred due to inability to monitor O2 readings. Author attempted x4 devices and all digits without reliable reading. X 2 readings of O2 93% on 2L with poor plinth wave.. Pt appears close to baseline functionally and would benefit from RW training and usage. HHPT is recommended at discharge. Skilled PT intervention is indicated to address deficits in function, mobility, and to return to PLOF as able.       Recommendations for follow up therapy are one component of a multi-disciplinary discharge planning process, led by the attending physician.  Recommendations may be updated based on patient status, additional functional criteria and insurance  authorization.  Follow Up Recommendations Home health PT    Assistance Recommended at Discharge Intermittent Supervision/Assistance  Functional Status Assessment Patient has had a recent decline in their functional status and demonstrates the ability to make significant improvements in function in a reasonable and predictable amount of time.  Equipment Recommendations  Rolling walker (2 wheels)    Recommendations for Other Services       Precautions / Restrictions Precautions Precautions: Fall Restrictions Weight Bearing Restrictions: No      Mobility  Bed Mobility Overal bed mobility: Modified Independent             General bed mobility comments: HOB elevated    Transfers Overall transfer level: Needs assistance Equipment used: None Transfers: Sit to/from Stand Sit to Stand: Supervision           General transfer comment: x 1 LOB requiring pt to sit down on bed, pt stated feeling weak; able to stand with feet apart w/o instability but fatigues quickly    Ambulation/Gait Ambulation/Gait assistance: Supervision Gait Distance (Feet): 30 Feet Assistive device: None Gait Pattern/deviations: Step-through pattern     General Gait Details: Unable to achieve accurate O2 readings, O2 assessment deferred, overall decreased energy with mobility  Stairs            Wheelchair Mobility    Modified Rankin (Stroke Patients Only)       Balance Overall balance assessment: Needs assistance Sitting-balance support: Feet supported Sitting balance-Leahy Scale: Normal     Standing balance support: No upper extremity supported Standing balance-Leahy Scale: Fair Standing balance comment: Poor to fair, pt did x1 LOB  rqeuiring sit to bed without warning but able to stand 2 min without support and amb in room                             Pertinent Vitals/Pain Pain Assessment: No/denies pain    Home Living Family/patient expects to be discharged to::  Private residence Living Arrangements: Other relatives (brother) Available Help at Discharge: Family;Available 24 hours/day Type of Home: House Home Access: Level entry Entrance Stairs-Rails: None Entrance Stairs-Number of Steps: 3   Home Layout: One level Home Equipment: Conservation officer, nature (2 wheels);Cane - single point      Prior Function Prior Level of Function : Independent/Modified Independent             Mobility Comments: Pt denies falls in the last 6 months and indep for all mobility ADLs Comments: Independent for all ADLs, drives; not currently on home O2     Hand Dominance   Dominant Hand: Right    Extremity/Trunk Assessment   Upper Extremity Assessment Upper Extremity Assessment: Generalized weakness    Lower Extremity Assessment Lower Extremity Assessment: Generalized weakness (SILT BLE, MMT 4/5 knee extensors, hip flexors)    Cervical / Trunk Assessment Cervical / Trunk Assessment: Normal  Communication   Communication: No difficulties  Cognition Arousal/Alertness: Awake/alert Behavior During Therapy: WFL for tasks assessed/performed Overall Cognitive Status: Within Functional Limits for tasks assessed                                 General Comments: AOx4, cooperative, a little anxious about changes in functional status while being in hospital        General Comments      Exercises Other Exercises Other Exercises: Unable to obtain O2 readings on fingers and toes; x2 portable pulse oxs, x 2 dynamaps providing limited readings, intermittent readings 90-93% on 2L [place]   Assessment/Plan    PT Assessment Patient needs continued PT services  PT Problem List Decreased strength;Decreased range of motion;Decreased activity tolerance;Decreased balance;Decreased mobility       PT Treatment Interventions Gait training;DME instruction;Functional mobility training;Therapeutic activities;Therapeutic exercise    PT Goals (Current goals can be  found in the Care Plan section)  Acute Rehab PT Goals Patient Stated Goal: to go home PT Goal Formulation: With patient Time For Goal Achievement: 09/15/21 Potential to Achieve Goals: Good    Frequency Min 2X/week   Barriers to discharge        Co-evaluation               AM-PAC PT "6 Clicks" Mobility  Outcome Measure Help needed turning from your back to your side while in a flat bed without using bedrails?: None Help needed moving from lying on your back to sitting on the side of a flat bed without using bedrails?: None Help needed moving to and from a bed to a chair (including a wheelchair)?: A Little Help needed standing up from a chair using your arms (e.g., wheelchair or bedside chair)?: None Help needed to walk in hospital room?: A Little Help needed climbing 3-5 steps with a railing? : A Little 6 Click Score: 21    End of Session Equipment Utilized During Treatment: Gait belt Activity Tolerance: Patient tolerated treatment well Patient left: in chair;with call bell/phone within reach;with chair alarm set Nurse Communication: Mobility status PT Visit Diagnosis: Muscle weakness (generalized) (M62.81);Other abnormalities of  gait and mobility (R26.89);Unsteadiness on feet (R26.81)    Time: 2174-7159 PT Time Calculation (min) (ACUTE ONLY): 44 min   Charges:             [employer], SPT

## 2021-09-01 NOTE — TOC Initial Note (Signed)
Transition of Care [facility]) - Initial/Assessment Note    Patient Details  Name: Cody Williams MRN: [id number] Date of Birth: 1940/12/01  Transition of Care [facility]) CM/SW Contact:    Magnus Ivan, LCSW Phone Number: 09/01/2021, 3:23 PM  Clinical Narrative:            Spoke to patient regarding DC planning. Patient lives with brother. Drives himself to appointments. PCP is Dr. Edwina Barth. No DME or HH history. Patient agreeable to rec for [facility]. No agency preference. Referral made to [facility] with [facility].        Expected Discharge Plan: [place] Barriers to Discharge: Continued Medical Work up   Patient Goals and CMS Choice Patient states their goals for this hospitalization and ongoing recovery are:: home with home health CMS Medicare.gov Compare Post Acute Care list provided to:: Patient Choice offered to / list presented to : Patient  Expected Discharge Plan and Services Expected Discharge Plan: [place]       Living arrangements for the past 2 months: Single Family Home                           HH Arranged: PT, OT [facility] Agency: [place] Date [facility] Agency Contacted: 09/01/21   Representative spoke with at [place]: Adela Lank  Prior Living Arrangements/Services Living arrangements for the past 2 months: [place] Lives with:: Siblings Patient language and need for interpreter reviewed:: Yes Do you feel safe going back to the place where you live?: Yes      Need for Family Participation in Patient Care: Yes (Comment) Care giver support system in place?: Yes (comment)   Criminal Activity/Legal Involvement Pertinent to Current Situation/Hospitalization: No - Comment as needed  Activities of Daily Living Home Assistive Devices/Equipment: None ADL Screening (condition at time of admission) Patient's cognitive ability adequate to safely complete daily activities?: No Is the patient deaf or have difficulty hearing?:  No Does the patient have difficulty seeing, even when wearing glasses/contacts?: No Does the patient have difficulty concentrating, remembering, or making decisions?: No Patient able to express need for assistance with ADLs?: No Does the patient have difficulty dressing or bathing?: No Independently performs ADLs?: Yes (appropriate for developmental age) Does the patient have difficulty walking or climbing stairs?: No Weakness of Legs: None Weakness of Arms/Hands: None  Permission Sought/Granted Permission sought to share information with : Facility Art therapist granted to share information with : Yes, Verbal Permission Granted     Permission granted to share info w AGENCY: [place] agencies        Emotional Assessment       Orientation: : Oriented to Self, Oriented to Place, Oriented to  Time, Oriented to Situation Alcohol / Substance Use: Not Applicable Psych Involvement: No (comment)  Admission diagnosis:  Pleural effusion on left [J90] Pleural effusion on right [J90] Pleural effusion, right [J90] Patient Active Problem List   Diagnosis Date Noted   Palliative care encounter    Pleural effusion, right 08/31/2021   Protein-calorie malnutrition, severe 08/31/2021   Pleural effusion on right 08/30/2021   Leukocytosis 08/30/2021   SIRS due to infectious process with acute organ dysfunction ([place]) 08/30/2021   Sepsis ([place]) 08/30/2021   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

## 2021-09-01 NOTE — Evaluation (Signed)
Occupational Therapy Evaluation Patient Details Name: Cody Williams MRN: [id number] DOB: 02-Apr-1941 Today's Date: 09/01/2021   History of Present Illness 80 year old male with history of tobacco use, hypertension, hyperlipidemia, glaucoma with right eye blindness who presented with shortness of breath, cough from home.  He lives with his brother.  Ambulatory at baseline.  He has reported weight loss since last few weeks.  On presentation he was mildly tachycardic, afebrile, normotensive.  He was saturating 91% on room air.  Chest x-ray revealed large right-sided pleural effusion.  He was also given a dose of ceftriaxone, azithromycin in the ED.  He was admitted for the management of right-sided pleural effusion with suspicion of malignancy.  Ultrasound-guided thoracentesis was done on 08/31/2021 with removal of 1.5 L of bloody fluid.   Clinical Impression   Patient presenting with decreased Ind in self care, balance, functional mobility/transfers, endurance, and safety awareness. Patient reports being independent without use of AD for self care,IADLs, and mobility PTA. Pt lives with brother. Patient currently functioning at supervision overall while on 2 Ls via [place]. Pt would benefit from energy conservation strategies for home use. Patient will benefit from acute OT to increase overall independence in the areas of ADLs, functional mobility, and safety awareness in order to safely discharge home.      Recommendations for follow up therapy are one component of a multi-disciplinary discharge planning process, led by the attending physician.  Recommendations may be updated based on patient status, additional functional criteria and insurance authorization.   Follow Up Recommendations  Home health OT    Assistance Recommended at Discharge Set up Supervision/Assistance  Functional Status Assessment  Patient has had a recent decline in their functional status and demonstrates the ability to make  significant improvements in function in a reasonable and predictable amount of time.  Equipment Recommendations  None recommended by OT       Precautions / Restrictions Precautions Precautions: Fall      Mobility Bed Mobility Overal bed mobility: Modified Independent             General bed mobility comments: HOB elevated but no physical assist    Transfers Overall transfer level: Needs assistance Equipment used: None Transfers: Sit to/from [employer] transfer comment: supervision      Balance Overall balance assessment: Needs assistance Sitting-balance support: Feet supported Sitting balance-Leahy Scale: Normal     Standing balance support: During functional activity Standing balance-Leahy Scale: Good                             ADL either performed or assessed with clinical judgement   ADL                                         General ADL Comments: supervision overall this session without use of AD while on 2 L via [place]     Vision Patient Visual Report: No change from baseline              Pertinent Vitals/Pain Pain Assessment: No/denies pain     Hand Dominance Right   Extremity/Trunk Assessment Upper Extremity Assessment Upper Extremity Assessment: Overall WFL for tasks assessed;Generalized weakness   Lower Extremity Assessment Lower Extremity Assessment: Overall WFL for tasks assessed;Generalized weakness  Communication Communication Communication: No difficulties   Cognition Arousal/Alertness: Awake/alert Behavior During Therapy: WFL for tasks assessed/performed Overall Cognitive Status: Within Functional Limits for tasks assessed                                 General Comments: Pt is pleasant and cooperative. A &O x4                Home Living Family/patient expects to be discharged to:: Private residence Living Arrangements: Other  relatives (lives with brother) Available Help at Discharge: Family;Available 24 hours/day Type of Home: House Home Access: Stairs to enter [employer] of Steps: 3 Entrance Stairs-Rails: None Home Layout: One level     Bathroom Shower/Tub: Tub/shower unit         Home Equipment: Conservation officer, nature (2 wheels);Cane - single point          Prior Functioning/Environment Prior Level of Function : Independent/Modified Independent             Mobility Comments: Pt reports Ind in all aspects of mobility. He does not endorse falls in last 6 months. He does not use O2 at baseline ADLs Comments: Pt reports Ind in all aspects of self care and IADL tasks. He drives, cooks, and performs laundry/home tasks without assist.        OT Problem List: Decreased strength;Decreased activity tolerance;Impaired balance (sitting and/or standing);Decreased safety awareness;Cardiopulmonary status limiting activity      OT Treatment/Interventions: Self-care/ADL training;Therapeutic exercise;Therapeutic activities;Energy conservation;DME and/or AE instruction;Patient/family education;Manual therapy;Balance training    OT Goals(Current goals can be found in the care plan section) Acute Rehab OT Goals Patient Stated Goal: to go home OT Goal Formulation: With patient Time For Goal Achievement: 09/15/21 Potential to Achieve Goals: Fair ADL Goals Pt Will Perform Grooming: with modified independence;standing Pt Will Perform Lower Body Dressing: with modified independence;sit to/from stand Pt Will Transfer to Toilet: with modified independence;ambulating Pt Will Perform Toileting - Clothing Manipulation and hygiene: with modified independence;sit to/from stand  OT Frequency: Min 2X/week   Barriers to D/C:    none known at this time          AM-PAC OT "6 Clicks" Daily Activity     Outcome Measure Help from another person eating meals?: None Help from another person taking care of personal  grooming?: None Help from another person toileting, which includes using toliet, bedpan, or urinal?: None Help from another person bathing (including washing, rinsing, drying)?: None Help from another person to put on and taking off regular upper body clothing?: None Help from another person to put on and taking off regular lower body clothing?: None 6 Click Score: 24   End of Session Equipment Utilized During Treatment: Oxygen Nurse Communication: Mobility status  Activity Tolerance: Patient tolerated treatment well Patient left: in bed;with call bell/phone within reach;with bed alarm set  OT Visit Diagnosis: Unsteadiness on feet (R26.81);Repeated falls (R29.6);Muscle weakness (generalized) (M62.81)                Time: 2831-5176 OT Time Calculation (min): 17 min Charges:  OT General Charges $OT Visit: 1 Visit OT Evaluation $OT Eval Low Complexity: 1 Low OT Treatments $Therapeutic Activity: 8-22 mins  Darleen Crocker, MS, OTR/L , CBIS ascom [phone]  09/01/21, 1:14 PM

## 2021-09-02 MED ORDER — NICOTINE 21 MG/24HR TD PT24: 21.0000 mg | MEDICATED_PATCH | Freq: Every day | TRANSDERMAL | 0 refills | Status: AC | PRN

## 2021-09-02 MED ORDER — MOMETASONE FURO-FORMOTEROL FUM 200-5 MCG/ACT IN AERO: 2.0000 | INHALATION_SPRAY | Freq: Two times a day (BID) | RESPIRATORY_TRACT | 1 refills | Status: AC

## 2021-09-02 MED ORDER — ALBUTEROL SULFATE HFA 108 (90 BASE) MCG/ACT IN AERS: 2.0000 | INHALATION_SPRAY | Freq: Four times a day (QID) | RESPIRATORY_TRACT | 2 refills | Status: AC | PRN

## 2021-09-02 MED ORDER — TIOTROPIUM BROMIDE MONOHYDRATE 18 MCG IN CAPS: 18.0000 ug | ORAL_CAPSULE | Freq: Every day | RESPIRATORY_TRACT | 1 refills | Status: AC

## 2021-09-02 MED ORDER — CEFDINIR 300 MG PO CAPS: 300.0000 mg | ORAL_CAPSULE | Freq: Two times a day (BID) | ORAL | 0 refills | Status: AC

## 2021-09-02 NOTE — Discharge Summary (Signed)
Physician Discharge Summary  Cody Williams PZW:[id number] DOB: 09-03-1941 DOA: 08/30/2021  PCP: Baxter Hire, MD  Admit date: 08/30/2021 Discharge date: 09/02/2021  Admitted From: Home Disposition:  Home  Discharge Condition:Stable CODE STATUS:FULL Diet recommendation: Heart Healthy    Brief/Interim Summary:   Patient is a 80 year old male with history of tobacco use, hypertension, hyperlipidemia, glaucoma with right eye blindness who presented with shortness of breath, cough from home.  He lives with his brother.  Ambulatory at baseline.  He has reported weight loss since last few weeks.  On presentation he was mildly tachycardic, afebrile, normotensive.  He was saturating 91% on room air.  Chest x-ray revealed large right-sided pleural effusion.  He was also given a dose of ceftriaxone, azithromycin in the ED.  He was admitted for the management of right-sided pleural effusion with suspicion of malignancy.  Ultrasound-guided thoracentesis was done on 08/31/2021 with removal of 1.5 L of bloody fluid.  Fluid analysis revealed elevated LDH, elevated white cell count.  Cytology and culture pending.  Pulmonology was also consulted who recommend outpatient follow-up for possible bronchoscopy.  Palliative care also consulted for possibility of malignancy with poor performance status,patient wants to pursue full scope of treatment.  He will be followed by pulmonology and oncology as an outpatient.  He is medically stable for discharge today.  Following problems were addressed during his hospitalization:   Right-sided pleural effusion/Right lung mass:  Ultrasound-guided thoracentesis was done on 08/31/2021 with removal of 1.5 L of bloody fluid.  Fluid analysis revealed elevated LDH, elevated white cell count.Cytology and culture pending.  CT chest showed spiculated mass in the right upper lobe measuring 1.9 x 1.4 x 1.0 cm suspicious for primary malignancy. Additional soft tissue mass of the  pleura along the medial right upper lobe with direct extension into the mediastinum may be due to metastatic disease or additional site of primary pleural malignancy.Right hilar, mediastinal, and right neck lymphadenopathy consistent with metastatic disease Pulmonology was also consulted who recommend outpatient follow-up for possible bronchoscopy.  Palliative care also consulted for possibility of malignancy with poor performance status.  Oncology aware and will follow up  as an outpatient.  Discussed the case with Dr. Grayland Ormond.  He will be called oncology and pulmonology as an outpatient.   Acute respiratory failure with hypoxia/COPD: Desaturated on room air on presentation.  Currently on 2 L of oxygen per minute.  Does not use oxygen at home. He will qualify for home oxygen on discharge.  He has been started on inhalers for COPD treatment   Suspicion for superimposed pneumonia, SIRS: Presented with tachypnea, tachycardia, leukocytosis.  Blood cultures NGTD.  Started on ceftriaxone at admission.  Remains afebrile.  Sepsis physiology has improved.Abx changed to oral on discharge   Hyperlipidemia: On Lipitor 10 mg daily   Hypertension: On amlodipine 5 mg daily.  This morning blood pressure is stable.   Debility/deconditioning/malnutrition: Looks extremely deconditioned, malnourished.  History of significant weight loss.  PT/OT recommended HH on DC       Discharge Diagnoses:  Principal Problem:   Pleural effusion on right Active Problems:   Hypertension   Hyperlipidemia   Carotid stenosis   Current tobacco use   Leukocytosis   SIRS due to infectious process with acute organ dysfunction (HCC)   Sepsis (HCC)   Pleural effusion, right   Protein-calorie malnutrition, severe   Palliative care encounter    Discharge Instructions  Discharge Instructions     Diet - low sodium heart healthy  Complete by: As directed    Discharge instructions   Complete by: As directed    1)Please  take prescribed medications as instructed 2)Follow up with your PCP in a week  3)You will be called by the oncology and pulmonology for the follow-up appointment   Increase activity slowly   Complete by: As directed       Allergies as of 09/02/2021   No Known Allergies      Medication List     STOP taking these medications    ofloxacin 0.3 % ophthalmic solution Commonly known as: OCUFLOX   timolol 0.25 % ophthalmic solution Commonly known as: TIMOPTIC       TAKE these medications    amLODipine 5 MG tablet Commonly known as: NORVASC take 1 tablet by mouth once daily   aspirin EC 81 MG tablet Take 81 mg by mouth daily.   atorvastatin 10 MG tablet Commonly known as: LIPITOR take 1 tablet by mouth once daily   dorzolamide-timolol 22.3-6.8 MG/ML ophthalmic solution Commonly known as: COSOPT 1 drop 2 (two) times daily.   mometasone-formoterol 200-5 MCG/ACT Aero Commonly known as: DULERA Inhale 2 puffs into the lungs 2 (two) times daily.   nicotine 21 mg/24hr patch Commonly known as: NICODERM CQ - dosed in mg/24 hours Place 1 patch (21 mg total) onto the skin daily as needed (nicotine craving).   tiotropium 18 MCG inhalation capsule Commonly known as: SPIRIVA Place 1 capsule (18 mcg total) into inhaler and inhale daily. Start taking on: September 03, 2021               Durable Medical Equipment  (From admission, onward)           Start     Ordered   09/02/21 0739  For home use only DME oxygen  Once       Question Answer Comment  Length of Need Lifetime   Mode or (Route) Nasal cannula   Liters per Minute 2   Frequency Continuous (stationary and portable oxygen unit needed)   Oxygen delivery system Gas      09/02/21 0738            Follow-up Information     Baxter Hire, MD. Schedule an appointment as soon as possible for a visit in 1 week(s).   Specialty: Internal Medicine Contact information: [place]  [phone]                No Known Allergies  Consultations: Pulmonary,Oncology  Procedures/Studies: DG Chest 2 View  Addendum Date: 08/30/2021   ADDENDUM REPORT: 08/30/2021 15:42 ADDENDUM: Correction: Original findings correct. IMPRESSION: Large "RIGHT" unilateral pleural effusion. Common differential would include parapneumonic effusion versus malignancy. Consider CT thorax with contrast for further characterization. Electronically Signed   By: Suzy Bouchard M.D.   On: 08/30/2021 15:42   Addendum Date: 08/30/2021   ADDENDUM REPORT: 08/30/2021 15:07 ADDENDUM: Correction in the findings section, "there is a large LEFT pleural effusion which layers dependent " Electronically Signed   By: Suzy Bouchard M.D.   On: 08/30/2021 15:07   Result Date: 08/30/2021 CLINICAL DATA:  Short of breath EXAM: CHEST - 2 VIEW COMPARISON:  None FINDINGS: Normal cardiac silhouette. Bilateral scarring within lungs greater on the RIGHT. There is a large RIGHT pleural effusion which layers dependently. IMPRESSION: Large LEFT unilateral pleural effusion. Common differential would include parapneumonic effusion versus malignancy. Consider CT thorax with contrast for further characterization. Electronically Signed: By: Nicole Kindred  Leonia Reeves M.D. On: 08/30/2021 11:12   CT CHEST W CONTRAST  Result Date: 08/31/2021 CLINICAL DATA:  80 year old male with history of tobacco use, hypertension, hyperlipidemia, glaucoma with right eye blindness who presented with shortness of breath, cough from home. He lives with his brother. Ambulatory at baseline. He has reported weight loss since last few weeks. On presentation he was mildly tachycardic, afebrile, normotensive. He was saturating 91% on room air. Chest x-ray revealed large right-sided pleural effusion. He was also given a dose of ceftriaxone, azithromycin in the ED. He was admitted for the management of right-sided pleural effusion with suspicion of malignancy.  EXAM: CT CHEST WITH CONTRAST TECHNIQUE: Multidetector CT imaging of the chest was performed during intravenous contrast administration. CONTRAST:  65mL OMNIPAQUE IOHEXOL 300 MG/ML  SOLN COMPARISON:  Chest radiograph 08/31/2021 FINDINGS: Cardiovascular: Heart size is within normal limits. Atherosclerotic calcifications seen throughout the thoracic aorta. No pulmonary artery embolism is identified. Mediastinum/Nodes: Mildly enlarged right supraclavicular lymph node is seen measuring 1.4 cm in short axis. Enlarged mediastinal lymph nodes are seen with large is located in the right paratracheal region measuring 1.4 cm in short axis. Enlarged subcarinal lymph node is present measuring 1.4 cm in short axis as well. There are enlarged lymph nodes in the right hilar region consistent with metastatic disease. Lungs/Pleura: Masslike thickening of the pleura of the right medial upper lung with direct extension into the mediastinum measuring approximately 4.1 x 3.5 x 2.9 cm is consistent with malignancy. He a spiculated mass is also seen in the right upper lobe on image 36 of series 3 which measures 1.9 x 1.4 x 1.0 cm. There are advanced emphysematous changes of the lungs. Minimal residual right pleural effusion. Bandlike opacity at the left lung apex likely due to scarring. Upper Abdomen: No acute abnormality. Musculoskeletal: No chest wall abnormality. No acute or significant osseous findings. IMPRESSION: 1. Spiculated mass in the right upper lobe measuring 1.9 x 1.4 x 1.0 cm is suspicious for primary malignancy. Additional soft tissue mass of the pleura along the medial right upper lobe with direct extension into the mediastinum may be due to metastatic disease or additional site of primary pleural malignancy. 2. 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 Aortic Atherosclerosis (ICD10-I70.0) and Emphysema (ICD10-J43.9). Electronically Signed   By: Miachel Roux M.D.   On:  08/31/2021 13:39   MR BRAIN W WO CONTRAST  Result Date: 09/01/2021 CLINICAL DATA:  Metastatic disease evaluation EXAM: MRI HEAD WITHOUT AND WITH CONTRAST TECHNIQUE: Multiplanar, multiecho pulse sequences of the brain and surrounding structures were obtained without and with intravenous contrast. CONTRAST:  48mL GADAVIST GADOBUTROL 1 MMOL/ML IV SOLN COMPARISON:  None. FINDINGS: Brain: No acute infarct, mass effect or extra-axial collection. No acute or chronic hemorrhage. There is multifocal hyperintense T2-weighted signal within the white matter. Generalized volume loss without a clear lobar predilection. the midline structures are normal. Vascular: Abnormal right ICA flow void, possibly occlusion or slow flow. Skull and upper cervical spine: Normal calvarium and skull base. Visualized upper cervical spine and soft tissues are normal. Sinuses/Orbits:No paranasal sinus fluid levels or advanced mucosal thickening. No mastoid or middle ear effusion. Normal orbits. IMPRESSION: 1. No intracranial metastatic disease. 2. Abnormal right ICA flow void, possibly occlusion or slow flow. Electronically Signed   By: Ulyses Jarred M.D.   On: 09/01/2021 23:24   CT ABDOMEN PELVIS W CONTRAST  Result Date: 09/01/2021 CLINICAL DATA:  Metastatic disease evaluation. Right upper lobe lung mass. EXAM: CT  ABDOMEN AND PELVIS WITH CONTRAST TECHNIQUE: Multidetector CT imaging of the abdomen and pelvis was performed using the standard protocol following bolus administration of intravenous contrast. CONTRAST:  74mL OMNIPAQUE IOHEXOL 300 MG/ML  SOLN COMPARISON:  Chest CT dated 08/31/2021. FINDINGS: Lower chest: Partially visualized right pleural effusion with small pockets of pleural air. Background of emphysema. Diffuse interstitial coarsening of the right lung. Thickened and nodular appearance of the right pleural surface. Please refer to the report for the chest CT of 08/31/2021 for lung findings. No intra-abdominal free air or free  fluid. Hepatobiliary: The liver is unremarkable. No intrahepatic biliary dilatation. The gallbladder is contracted no calcified gallstone. Pancreas: Unremarkable. No pancreatic ductal dilatation or surrounding inflammatory changes. Spleen: Normal in size without focal abnormality. Adrenals/Urinary Tract: The adrenal glands are unremarkable. The kidneys, visualized ureters, and urinary bladder appear unremarkable. Stomach/Bowel: There is no bowel obstruction or active inflammation. The appendix is normal. There is sigmoid diverticulosis without active inflammatory changes. Vascular/Lymphatic: Advanced aortoiliac atherosclerotic disease. The IVC is unremarkable. No portal venous gas. There is no adenopathy. Reproductive: The prostate and seminal vesicles are grossly unremarkable. No pelvic mass. Other: None Musculoskeletal: Degenerative changes of the lower lumbar spine. No acute osseous pathology. IMPRESSION: 1. No acute intra-abdominal or pelvic pathology. No evidence of metastatic disease in the abdomen or pelvis. 2. Sigmoid diverticulosis. 3. Partially visualized right pleural effusion with small pockets of pleural air. 4. Thickened and nodular appearance of the right pleural surface. 5. Aortic Atherosclerosis (ICD10-I70.0) and Emphysema (ICD10-J43.9). Electronically Signed   By: Anner Crete M.D.   On: 09/01/2021 21:01   DG Chest Port 1 View  Result Date: 08/31/2021 CLINICAL DATA:  Post right thoracentesis EXAM: PORTABLE CHEST 1 VIEW COMPARISON:  08/30/2021 FINDINGS: Decreased right pleural effusion which is now small. Airspace disease is present throughout the right lung. No pneumothorax Hyperinflation left lung. Apical linear density is unchanged likely scarring. Left lung base clear. No left effusion IMPRESSION: No complication post right thoracentesis. Electronically Signed   By: Franchot Gallo M.D.   On: 08/31/2021 12:32   VAS US CAROTID  Result Date: 08/15/2021 Carotid Arterial Duplex Study  Patient Name:  DANFORD TAT  Date of Exam:   08/04/2021 Medical Rec #: [id number]        Accession #:    [id number] Date of Birth: Sep 19, 1941       Patient Gender: M Patient Age:   71 years Exam Location:  [facility] Procedure:      VAS US CAROTID Referring Phys: Leotis Pain --------------------------------------------------------------------------------  Indications: Carotid artery disease. Performing Technologist: Blondell Reveal RT, RDMS, RVT  Examination Guidelines: A complete evaluation includes B-mode imaging, spectral Doppler, color Doppler, and power Doppler as needed of all accessible portions of each vessel. Bilateral testing is considered an integral part of a complete examination. Limited examinations for reoccurring indications may be performed as noted.  Right Carotid Findings: +----------+--------+--------+--------+------------------+--------+           PSV cm/sEDV cm/sStenosisPlaque DescriptionComments +----------+--------+--------+--------+------------------+--------+ CCA Prox  123     10                                         +----------+--------+--------+--------+------------------+--------+ CCA Mid   107     14                                         +----------+--------+--------+--------+------------------+--------+  CCA Distal59      13              heterogenous               +----------+--------+--------+--------+------------------+--------+ ICA                       Occludedheterogenous               +----------+--------+--------+--------+------------------+--------+ ECA       114     12                                         +----------+--------+--------+--------+------------------+--------+ +----------+--------+-------+----------------+-------------------+           PSV cm/sEDV cmsDescribe        Arm Pressure (mmHG) +----------+--------+-------+----------------+-------------------+ [id number]            Multiphasic, WNL                     +----------+--------+-------+----------------+-------------------+ +---------+--------+--+--------+---------+ VertebralPSV cm/s81EDV cm/sAntegrade +---------+--------+--+--------+---------+  Left Carotid Findings: +----------+--------+--------+--------+------------------+--------+           PSV cm/sEDV cm/sStenosisPlaque DescriptionComments +----------+--------+--------+--------+------------------+--------+ CCA Prox  146     30                                         +----------+--------+--------+--------+------------------+--------+ CCA Mid   138     29                                         +----------+--------+--------+--------+------------------+--------+ CCA Distal112     23                                         +----------+--------+--------+--------+------------------+--------+ ICA Prox  134     36      1-39%   calcific                   +----------+--------+--------+--------+------------------+--------+ ICA Mid   84      31                                         +----------+--------+--------+--------+------------------+--------+ ICA Distal95      32                                         +----------+--------+--------+--------+------------------+--------+ ECA       114     16                                         +----------+--------+--------+--------+------------------+--------+ +----------+--------+--------+----------------+-------------------+           PSV cm/sEDV cm/sDescribe        Arm Pressure (mmHG) +----------+--------+--------+----------------+-------------------+ [id number]             Multiphasic, WNL                    +----------+--------+--------+----------------+-------------------+ +---------+--------+--+--------+---------+  VertebralPSV cm/s39EDV cm/sAntegrade +---------+--------+--+--------+---------+  Summary: 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 Left Carotid: Velocities in  the left ICA are consistent with a 1-39% stenosis.               The extracranial vessels were near-normal with only minimal wall               thickening or plaque. No significant change noted when compared to the previous exam on 08/05/20. *See table(s) above for measurements and observations.  Electronically signed by Leotis Pain MD on 08/15/2021 at 9:06:59 AM.    Final    US THORACENTESIS ASP PLEURAL SPACE W/IMG GUIDE  Result Date: 08/31/2021 CLINICAL DATA:  Right pleural effusion. EXAM: ULTRASOUND GUIDED RIGHT THORACENTESIS COMPARISON:  None. PROCEDURE: An ultrasound guided thoracentesis was thoroughly discussed with the patient and questions answered. The benefits, risks, alternatives and complications were also discussed. The patient understands and wishes to proceed with the procedure. Written consent was obtained. Ultrasound was performed to localize and mark an adequate pocket of fluid in the right chest. The area was then prepped and draped in the normal sterile fashion. 1% Lidocaine was used for local anesthesia. Under ultrasound guidance a 6 French Safe-T-Centesis catheter was introduced. Thoracentesis was performed. The catheter was removed and a dressing applied. COMPLICATIONS: None FINDINGS: A total of approximately 1.5 L of bloody fluid was removed. A fluid sample was sent for laboratory analysis. IMPRESSION: Successful ultrasound guided right thoracentesis yielding 1.5 L of pleural fluid. Electronically Signed   By: Aletta Edouard M.D.   On: 08/31/2021 13:18      Subjective: Patient seen and examined at the bedside this morning.  Hemodynamically   stable for discharge  Discharge Exam: Vitals:   09/02/21 0423 09/02/21 0828  BP: 114/61 (!) 102/56  Pulse: 97 99  Resp: 19 (!) 24  Temp: 98.4 F (36.9 C) 98.3 F (36.8 C)  SpO2: 96% 92%   Vitals:   09/01/21 1548 09/01/21 2152 09/02/21 0423 09/02/21 0828  BP: (!) 112/53 (!) 143/63 114/61 (!) 102/56  Pulse: 100 98 97 99  Resp: 18 17  19  (!) 24  Temp: 98.1 F (36.7 C) 98.5 F (36.9 C) 98.4 F (36.9 C) 98.3 F (36.8 C)  TempSrc:  Oral    SpO2: 91% 95% 96% 92%  Weight:      Height:        General: Pt is alert, awake, not in acute distress Cardiovascular: RRR, S1/S2 +, no rubs, no gallops Respiratory: CTA bilaterally, no wheezing, no rhonchi Abdominal: Soft, NT, ND, bowel sounds + Extremities: no edema, no cyanosis    The results of significant diagnostics from this hospitalization (including imaging, microbiology, ancillary and laboratory) are listed below for reference.     Microbiology: Recent Results (from the past 240 hour(s))  Resp Panel by RT-PCR (Flu A&B, Covid) Nasopharyngeal Swab     Status: None   Collection Time: 08/30/21  2:52 PM   Specimen: Nasopharyngeal Swab; Nasopharyngeal(NP) swabs in vial transport medium  Result Value Ref Range Status   SARS Coronavirus 2 by RT PCR NEGATIVE NEGATIVE Final    Comment: (NOTE) SARS-CoV-2 target nucleic acids are NOT DETECTED.  The SARS-CoV-2 RNA is generally detectable in upper respiratory specimens during the acute phase of infection. The lowest concentration of SARS-CoV-2 viral copies this assay can detect is 138 copies/mL. A negative result does not preclude SARS-Cov-2 infection and should not be used as the sole basis for treatment or other patient  management decisions. A negative result may occur with  improper specimen collection/handling, submission of specimen other than nasopharyngeal swab, presence of viral mutation(s) within the areas targeted by this assay, and inadequate number of viral copies(<138 copies/mL). A negative result must be combined with clinical observations, patient history, and epidemiological information. The expected result is Negative.  Fact Sheet for Patients:  EntrepreneurPulse.com.au  Fact Sheet for Healthcare Providers:  IncredibleEmployment.be  This test is no t yet approved or  cleared by the [place] FDA and  has been authorized for detection and/or diagnosis of SARS-CoV-2 by FDA under an Emergency Use Authorization (EUA). This EUA will remain  in effect (meaning this test can be used) for the duration of the COVID-19 declaration under Section 564(b)(1) of the Act, 21 U.S.C.section 360bbb-3(b)(1), unless the authorization is terminated  or revoked sooner.       Influenza A by PCR NEGATIVE NEGATIVE Final   Influenza B by PCR NEGATIVE NEGATIVE Final    Comment: (NOTE) The Xpert Xpress SARS-CoV-2/FLU/RSV plus assay is intended as an aid in the diagnosis of influenza from Nasopharyngeal swab specimens and should not be used as a sole basis for treatment. Nasal washings and aspirates are unacceptable for Xpert Xpress SARS-CoV-2/FLU/RSV testing.  Fact Sheet for Patients: EntrepreneurPulse.com.au  Fact Sheet for Healthcare Providers: IncredibleEmployment.be  This test is not yet approved or cleared by the [place] FDA and has been authorized for detection and/or diagnosis of SARS-CoV-2 by FDA under an Emergency Use Authorization (EUA). This EUA will remain in effect (meaning this test can be used) for the duration of the COVID-19 declaration under Section 564(b)(1) of the Act, 21 U.S.C. section 360bbb-3(b)(1), unless the authorization is terminated or revoked.  Performed at [facility], [address] [place], [place] [address]   Blood culture (routine x 2)     Status: None (Preliminary result)   Collection Time: 08/30/21  3:37 PM   Specimen: BLOOD  Result Value Ref Range Status   Specimen Description BLOOD LEFT ANTECUBITAL  Final   Special Requests   Final    AEROBIC BOTTLE ONLY Blood Culture results may not be optimal due to an inadequate volume of blood received in culture bottles   Culture   Final    NO GROWTH 3 DAYS Performed at [facility], [address] [place], [place]  [address]    Report Status PENDING  Incomplete  Culture, blood (Routine X 2) w Reflex to ID Panel     Status: None (Preliminary result)   Collection Time: 08/31/21 12:29 AM   Specimen: BLOOD  Result Value Ref Range Status   Specimen Description BLOOD LEFT HAND  Final   Special Requests   Final    BOTTLES DRAWN AEROBIC ONLY Blood Culture results may not be optimal due to an inadequate volume of blood received in culture bottles   Culture   Final    NO GROWTH 2 DAYS Performed at [facility], [address] [place], [place] [address]    Report Status PENDING  Incomplete  Body fluid culture w Gram Stain     Status: None (Preliminary result)   Collection Time: 08/31/21 12:13 PM   Specimen: PATH Cytology Pleural fluid  Result Value Ref Range Status   Specimen Description   Final    PLEURAL Performed at [facility], [address] [place], [place] [address]    Special Requests   Final    NONE Performed at [facility], [phone] [place], [place] [address]    Gram Stain PENDING  Incomplete   Culture   Final    NO GROWTH 2 DAYS Performed at [facility], [place] [address], [place], [place] [address]    Report Status PENDING  Incomplete     Labs: BNP (last 3 results) Recent Labs    08/30/21 1537  BNP 93.8   Basic Metabolic Panel: Recent Labs  Lab 08/30/21 1035 08/31/21 0029  NA 141 141  K 4.6 4.1  CL 100 102  CO2 30 29  GLUCOSE 123* 89  BUN 35* 29*  CREATININE 0.89 0.89  CALCIUM 9.5 9.2  MG  --  2.2  PHOS  --  4.0   Liver Function Tests: Recent Labs  Lab 08/31/21 0029  ALBUMIN 2.2*   No results for input(s): LIPASE, AMYLASE in the last 168 hours. No results for input(s): AMMONIA in the last 168 hours. CBC: Recent Labs  Lab 08/30/21 1035 08/31/21 0029 09/01/21 0652  WBC 18.2* 17.8* 17.0*  NEUTROABS  --   --  14.5*  HGB 13.3 12.6* 13.3  HCT 41.1 39.7 43.4  MCV 87.1 90.6 92.5  PLT 713* 577* 582*   Cardiac  Enzymes: No results for input(s): CKTOTAL, CKMB, CKMBINDEX, TROPONINI in the last 168 hours. BNP: Invalid input(s): POCBNP CBG: No results for input(s): GLUCAP in the last 168 hours. D-Dimer No results for input(s): DDIMER in the last 72 hours. Hgb A1c No results for input(s): HGBA1C in the last 72 hours. Lipid Profile No results for input(s): CHOL, HDL, LDLCALC, TRIG, CHOLHDL, LDLDIRECT in the last 72 hours. Thyroid function studies No results for input(s): TSH, T4TOTAL, T3FREE, THYROIDAB in the last 72 hours.  Invalid input(s): FREET3 Anemia work up No results for input(s): VITAMINB12, FOLATE, FERRITIN, TIBC, IRON, RETICCTPCT in the last 72 hours. Urinalysis    Component Value Date/Time   COLORURINE YELLOW (A) 08/30/2021 2141   APPEARANCEUR HAZY (A) 08/30/2021 2141   LABSPEC 1.028 08/30/2021 2141   PHURINE 5.0 08/30/2021 2141   GLUCOSEU NEGATIVE 08/30/2021 2141   HGBUR NEGATIVE 08/30/2021 2141   BILIRUBINUR NEGATIVE 08/30/2021 2141   KETONESUR 5 (A) 08/30/2021 2141   PROTEINUR 30 (A) 08/30/2021 2141   NITRITE NEGATIVE 08/30/2021 2141   LEUKOCYTESUR NEGATIVE 08/30/2021 2141   Sepsis Labs Invalid input(s): PROCALCITONIN,  WBC,  LACTICIDVEN Microbiology Recent Results (from the past 240 hour(s))  Resp Panel by RT-PCR (Flu A&B, Covid) Nasopharyngeal Swab     Status: None   Collection Time: 08/30/21  2:52 PM   Specimen: Nasopharyngeal Swab; Nasopharyngeal(NP) swabs in vial transport medium  Result Value Ref Range Status   SARS Coronavirus 2 by RT PCR NEGATIVE NEGATIVE Final    Comment: (NOTE) SARS-CoV-2 target nucleic acids are NOT DETECTED.  The SARS-CoV-2 RNA is generally detectable in upper respiratory specimens during the acute phase of infection. The lowest concentration of SARS-CoV-2 viral copies this assay can detect is 138 copies/mL. A negative result does not preclude SARS-Cov-2 infection and should not be used as the sole basis for treatment or other patient  management decisions. A negative result may occur with  improper specimen collection/handling, submission of specimen other than nasopharyngeal swab, presence of viral mutation(s) within the areas targeted by this assay, and inadequate number of viral copies(<138 copies/mL). A negative result must be combined with clinical observations, patient history, and epidemiological information. The expected result is Negative.  Fact Sheet for Patients:  EntrepreneurPulse.com.au  Fact Sheet for Healthcare Providers:  IncredibleEmployment.be  This test is no t yet approved or cleared by the [place] and  has been authorized for detection and/or diagnosis of SARS-CoV-2 by FDA under an Emergency Use Authorization (EUA). This EUA will remain  in effect (meaning this test can be used) for the duration of the COVID-19 declaration under Section 564(b)(1) of the Act, 21 U.S.C.section 360bbb-3(b)(1), unless the authorization is terminated  or revoked sooner.       Influenza A by PCR NEGATIVE NEGATIVE Final   Influenza B by PCR NEGATIVE NEGATIVE Final    Comment: (NOTE) The Xpert Xpress SARS-CoV-2/FLU/RSV plus assay is intended as an aid in the diagnosis of influenza from Nasopharyngeal swab specimens and should not be used as a sole basis for treatment. Nasal washings and aspirates are unacceptable for Xpert Xpress SARS-CoV-2/FLU/RSV testing.  Fact Sheet for Patients: EntrepreneurPulse.com.au  Fact Sheet for Healthcare Providers: IncredibleEmployment.be  This test is not yet approved or cleared by the [place] FDA and has been authorized for detection and/or diagnosis of SARS-CoV-2 by FDA under an Emergency Use Authorization (EUA). This EUA will remain in effect (meaning this test can be used) for the duration of the COVID-19 declaration under Section 564(b)(1) of the Act, 21 U.S.C. section 360bbb-3(b)(1),  unless the authorization is terminated or revoked.  Performed at [facility], [place] [place], [place] [address]   Blood culture (routine x 2)     Status: None (Preliminary result)   Collection Time: 08/30/21  3:37 PM   Specimen: BLOOD  Result Value Ref Range Status   Specimen Description BLOOD LEFT ANTECUBITAL  Final   Special Requests   Final    AEROBIC BOTTLE ONLY Blood Culture results may not be optimal due to an inadequate volume of blood received in culture bottles   Culture   Final    NO GROWTH 3 DAYS Performed at [facility], [address] [place], [place] [address]    Report Status PENDING  Incomplete  Culture, blood (Routine X 2) w Reflex to ID Panel     Status: None (Preliminary result)   Collection Time: 08/31/21 12:29 AM   Specimen: BLOOD  Result Value Ref Range Status   Specimen Description BLOOD LEFT HAND  Final   Special Requests   Final    BOTTLES DRAWN AEROBIC ONLY Blood Culture results may not be optimal due to an inadequate volume of blood received in culture bottles   Culture   Final    NO GROWTH 2 DAYS Performed at [facility], [address], [place], [place] [address]    Report Status PENDING  Incomplete  Body fluid culture w Gram Stain     Status: None (Preliminary result)   Collection Time: 08/31/21 12:13 PM   Specimen: PATH Cytology Pleural fluid  Result Value Ref Range Status   Specimen Description   Final    PLEURAL Performed at [facility], [address] [place], [place] [address]    Special Requests   Final    NONE Performed at [facility], [address], [place], [place] [address]    Gram Stain PENDING  Incomplete   Culture   Final    NO GROWTH 2 DAYS Performed at [facility], [place] [address], [place],  [address]    Report Status PENDING  Incomplete    Please note: You were cared for by a hospitalist during your hospital stay. Once you are discharged,  your primary  care physician will handle any further medical issues. Please note that NO REFILLS for any discharge medications will be authorized once you are discharged, as it is imperative that you return to your primary care physician (or establish a relationship with a primary care physician if you do not have one) for your post hospital discharge needs so that they can reassess your need for medications and monitor your lab values.    Time coordinating discharge: 40 minutes  SIGNED:   Shelly Coss, MD  [facility] 09/02/2021, 10:42 AM Pager [phone]  If 7PM-7AM, please contact night-coverage [email or web address] Password [email or web address]

## 2021-09-02 NOTE — Progress Notes (Signed)
On oxygen at 2L [place], saturation 100%.  Ambulated patient in the hallway on room air, O2 saturation down to 84-85%, SOB.  Assisted to room and placed O2 back on at 2L.  Saturation went back up to 94%.  Needs addressed.

## 2021-09-02 NOTE — Progress Notes (Signed)
SATURATION QUALIFICATIONS: (This note is used to comply with regulatory documentation for home oxygen)  Patient Saturations on Room Air at Rest = 90%  Patient Saturations on Room Air while Ambulating = 84%  Patient Saturations on 2 Liters of oxygen while Ambulating = 94%  Please briefly explain why patient needs home oxygen: desaturation on room air

## 2021-09-02 NOTE — TOC Progression Note (Signed)
Transition of Care [facility]) - Progression Note    Patient Details  Name: Cody Williams MRN: [id number] Date of Birth: Jun 18, 1941  [facility] CM/SW Contact  Izola Price, RN Phone Number: 09/02/2021, 1:45 PM  Clinical Narrative:    Patient has discharge orders but home oxygen needs to be set up and brother, Korban Shearer, is not home until tomorrow. [employer] notified for DME RW and home oxygen. Qualifying test in chart. Orders in. Spoke with sister as no answer in patient room or phone numbers. Brother number is [phone]. CM will contact in am for transportation home/receive at home. Provider updated Simmie Davies RN CM       Expected Discharge Plan: [place] Barriers to Discharge: Transportation, Other (must enter comment) (No one to receive at home till tomorrow.)  Expected Discharge Plan and Services Expected Discharge Plan: [place] arrangements for the past 2 months: Single Family Home Expected Discharge Date: 09/02/21                         HH Arranged: PT, OT HH Agency: [place] Date [facility] Agency Contacted: 09/01/21   Representative spoke with at [place]: Adela Lank   Social Determinants of Health (SDOH) Interventions    Readmission Risk Interventions Readmission Risk Prevention Plan 09/01/2021  Post Dischage Appt Complete  Medication Screening Complete  Transportation Screening Complete  Some recent data might be hidden

## 2021-09-03 LAB — BODY FLUID CULTURE W GRAM STAIN: Culture: NO GROWTH

## 2021-09-03 NOTE — Progress Notes (Addendum)
Patient seen and examined the bedside this morning.  Hemodynamically stable.  He feels comfortable, no active issues.   Discharge orders and summary are in place.  No new change to medical management

## 2021-09-03 NOTE — TOC Transition Note (Signed)
Transition of Care [facility]) - CM/SW Discharge Note   Patient Details  Name: Cody Williams MRN: [id number] Date of Birth: Nov 14, 1940  Transition of Care [facility]) CM/SW Contact:  Cody Price, RN Phone Number: 09/03/2021, 1:57 PM   Clinical Narrative:  Contacted brother who is now at the home. Patient has received portable condenser via [employer] DME oxygen. HH via [facility] notified of DC. Spoke with brother regarding need to charge portable condenser and what company will deliver larger condenser. Transport oxygen delivered to room. Sister will transport patient home where he lives with brother Cody Williams. Communicated with Unit RN and provider that everything is in place and someone at home to let patient in.  Simmie Davies RN CM    Final next level of care: [place] Barriers to Discharge: Barriers Resolved   Patient Goals and CMS Choice Patient states their goals for this hospitalization and ongoing recovery are:: home with home health CMS Medicare.gov Compare Post Acute Care list provided to:: Patient Choice offered to / list presented to : Patient  Discharge Placement                       Discharge Plan and Services                DME Arranged: Oxygen DME Agency: [employer] Date DME Agency Contacted: 09/02/21 Time DME Agency Contacted: 1500 Representative spoke with at DME Agency: [place]: PT, OT [place] Agency: [place] Date [place]: 09/01/21   Representative spoke with at [place]: Notified Tommi Rumps today of discharge.  Social Determinants of Health (SDOH) Interventions     Readmission Risk Interventions Readmission Risk Prevention Plan 09/01/2021  Post Dischage Appt Complete  Medication Screening Complete  Transportation Screening Complete  Some recent data might be hidden

## 2021-09-03 NOTE — Progress Notes (Signed)
Nursing Discharge Note   Admit Date: 08/30/2021  Discharge date: 09/03/2021  Cody Williams is to be discharged home per MD order.  AVS completed. Reviewed with patient and family at bedside. Highlighted copy provided for patient to take home.  Patient/caregiver able to verbalize understanding of discharge instructions. PIV removed. Patient stable upon discharge.     Discharge Instructions      Suggestions For Increasing Calories And Protein Several small meals a day are easier to eat and digest than three large ones. Space meals about 2 to 3 hours apart to maximize comfort. Stop eating 2 to 3 hours before bed and sleep with your head elevated if gastric reflux (GERD) and heartburn are problems. Do not eat your favorite foods if you are feeling bad. Save them for when you feel good! Eat breakfast-type foods at any meal. Eggs are usually easy to eat and are great any time of the day. (The same goes for pancakes and waffles.) Eat when you feel hungry. Most people have the greatest appetite in the morning because they have not eaten all night. If this is the best meal for you, then pile on those calories and other nutrients in the morning and at lunch. Then you can have a smaller dinner without losing total calories for the day. Eat leftovers or nutritious snacks in the afternoon and early evening to round out your day. Try homemade or commercially prepared nutrition bars and puddings, as well as calorie- and protein-rich liquid nutritional supplements. Benefits of Physical Activity Talk to your doctor about physical activity. Light or moderate physical activity can help maintain muscle and promote an appetite. Walking in the neighborhood or the local mall is a great way to get up, get out, and get moving. If you are unsteady on your feet, try walking around the dining room table. Save Room for [employer]! Drink most fluids between meals instead of with meals. (It is fine to have a sip to  help swallow food at meal time.) Fluids (which usually have fewer calories and nutrients than solid food) can take up valuable space in your stomach.  Foods Recommended High-Protein Foods Milk products Add cheese to toast, crackers, sandwiches, baked potatoes, vegetables, soups, noodles, meat, and fruit. Use reduced-fat (2%) or whole milk in place of water when cooking cereal and cream soups. Include cream sauces on vegetables and pasta. Add powdered milk to cream soups and mashed potatoes.  Eggs Have hard-cooked eggs readily available in the refrigerator. Chop and add to salads, casseroles, soups, and vegetables. Make a quick egg salad. All eggs should be well cooked to avoid the risk of harmful bacteria.  Meats, poultry, and fish Add leftover cooked meats to soups, casseroles, salads, and omelets. Make dip by mixing diced, chopped, or shredded meat with sour cream and spices.  Beans, legumes, nuts, and seeds Sprinkle nuts and seeds on cereals, fruit, and desserts such as ice cream, pudding, and custard. Also serve nuts and seeds on vegetables, salads, and pasta. Spread peanut butter on toast, bread, English muffins, and fruit, or blend it in a milk shake. Add beans and peas to salads, soups, casseroles, and vegetable dishes.  High-Calorie Foods Butter, margarine, and  oils Melt butter or margarine over potatoes, rice, pasta, and cooked vegetables. Add melted butter or margarine into soups and casseroles and spread on bread for sandwiches before spreading sandwich spread or peanut butter. Saut or stir-fry vegetables, meats, chicken and fish such as shrimp/scallops in olive or canola oil. A  variety of oils add calories and can be used to [employer], chicken, or fish.  Milk products Add whipping cream to desserts, pancakes, waffles, fruit, and hot chocolate, and fold it into soups and casseroles. Add sour cream to baked potatoes and vegetables.  Salad dressing Use regular (not low-fat or diet)  mayonnaise and salad dressing on sandwiches and in dips with vegetables and fruit.   Sweets Add jelly and honey to bread and crackers. Add jam to fruit and ice cream and as a topping over cake.   Copyright 2020  Academy of Nutrition and Dietetics. All rights reserved.        Allergies as of 09/03/2021   No Known Allergies      Medication List     STOP taking these medications    ofloxacin 0.3 % ophthalmic solution Commonly known as: OCUFLOX   timolol 0.25 % ophthalmic solution Commonly known as: TIMOPTIC       TAKE these medications    albuterol 108 (90 Base) MCG/ACT inhaler Commonly known as: VENTOLIN HFA Inhale 2 puffs into the lungs every 6 (six) hours as needed for wheezing or shortness of breath.   amLODipine 5 MG tablet Commonly known as: NORVASC take 1 tablet by mouth once daily   aspirin EC 81 MG tablet Take 81 mg by mouth daily.   atorvastatin 10 MG tablet Commonly known as: LIPITOR take 1 tablet by mouth once daily   cefdinir 300 MG capsule Commonly known as: OMNICEF Take 1 capsule (300 mg total) by mouth 2 (two) times daily for 5 days.   dorzolamide-timolol 22.3-6.8 MG/ML ophthalmic solution Commonly known as: COSOPT 1 drop 2 (two) times daily.   mometasone-formoterol 200-5 MCG/ACT Aero Commonly known as: DULERA Inhale 2 puffs into the lungs 2 (two) times daily.   nicotine 21 mg/24hr patch Commonly known as: NICODERM CQ - dosed in mg/24 hours Place 1 patch (21 mg total) onto the skin daily as needed (nicotine craving).   tiotropium 18 MCG inhalation capsule Commonly known as: SPIRIVA Place 1 capsule (18 mcg total) into inhaler and inhale daily.               Durable Medical Equipment  (From admission, onward)           Start     Ordered   09/02/21 0739  For home use only DME oxygen  Once       Question Answer Comment  Length of Need Lifetime   Mode or (Route) Nasal cannula   Liters per Minute 2   Frequency Continuous  (stationary and portable oxygen unit needed)   Oxygen delivery system Gas      09/02/21 0738             Discharge Instructions/ Education: Discharge instructions given to patient/family with verbalized understanding. Discharge education completed with patient/family including: follow up instructions, medication list, discharge activities, and limitations if indicated. Additional discharge instructions as indicated by discharging provider also reviewed. Patient and family able to verbalize understanding, all questions fully answered. Patient instructed to return to Emergency Department, call 911, or call MD for any changes in condition.  Patient escorted via wheelchair to lobby and discharged home via private automobile.   Wheeled to lobby with home oxygen and home DME of rolling walker.

## 2021-09-04 LAB — CULTURE, BLOOD (ROUTINE X 2): Culture: NO GROWTH

## 2021-09-04 LAB — CYTOLOGY - NON PAP

## 2021-09-04 NOTE — Progress Notes (Signed)
[facility] - No charge note  Received lab notification regarding pleural body fluid culture.  Cytology collected on 08/31/2021  None Pap cytology from thoracentesis via the right pleural effusion - Read as metastatic adenocarcinoma is present, positive for malignancy - The results were forwarded to Dr. Grayland Ormond, medical oncology, and Dr. Mortimer Fries, pulmonologist via epic - I attempted to call patient with the updates on this pathology reading at [phone], there is no pickup - I then attempted to call sister Legrand Rams at [phone], there was no pickup Legrand Rams called me back and I discussed extensively with her.  I confirmed that the pleural effusion was positive for cancer.  I further endorsed that she will need to ensure her brother follows up with medical oncologist and pulmonologist.  I further endorsed that patient will need to let his PCP know, Dr. Harrel Lemon. - Ms. Doylene Bode was able to repeat the above instructions to me without difficulty.  Dr. Tobie Poet  [facility]

## 2021-09-05 LAB — CULTURE, BLOOD (ROUTINE X 2): Culture: NO GROWTH

## 2021-09-08 DIAGNOSIS — J9611 Chronic respiratory failure with hypoxia: Secondary | ICD-10-CM | POA: Diagnosis not present

## 2021-09-08 DIAGNOSIS — J90 Pleural effusion, not elsewhere classified: Secondary | ICD-10-CM | POA: Diagnosis not present

## 2021-09-08 DIAGNOSIS — R918 Other nonspecific abnormal finding of lung field: Secondary | ICD-10-CM | POA: Diagnosis not present

## 2021-09-08 DIAGNOSIS — Z09 Encounter for follow-up examination after completed treatment for conditions other than malignant neoplasm: Secondary | ICD-10-CM | POA: Diagnosis not present

## 2021-09-14 ENCOUNTER — Other Ambulatory Visit: Payer: Self-pay

## 2021-09-14 ENCOUNTER — Encounter: Payer: Self-pay | Admitting: Internal Medicine

## 2021-09-14 ENCOUNTER — Ambulatory Visit: Payer: Medicare HMO | Admitting: Internal Medicine

## 2021-09-14 DIAGNOSIS — F17210 Nicotine dependence, cigarettes, uncomplicated: Secondary | ICD-10-CM | POA: Insufficient documentation

## 2021-09-14 DIAGNOSIS — J449 Chronic obstructive pulmonary disease, unspecified: Secondary | ICD-10-CM

## 2021-09-14 DIAGNOSIS — J910 Malignant pleural effusion: Secondary | ICD-10-CM

## 2021-09-14 DIAGNOSIS — J9611 Chronic respiratory failure with hypoxia: Secondary | ICD-10-CM | POA: Diagnosis not present

## 2021-09-14 DIAGNOSIS — J9621 Acute and chronic respiratory failure with hypoxia: Secondary | ICD-10-CM | POA: Insufficient documentation

## 2021-09-14 LAB — MISC LABCORP TEST (SEND OUT): Labcorp test code: 5367

## 2021-09-14 NOTE — Assessment & Plan Note (Signed)
R thoracenteis  11/3  X 1.5 liters :  Exudate, glucose not done, cyt Pos adenoca  He has no symptoms now including able to lie flat and making progress with PT albeit on 02  For now  Clearly needs to get started on systemic rx asap and can use the rate of fluid reacumulation to help judge response to therapy so would wait for recurrent symptoms to do tap or consider pleurex per IR

## 2021-09-14 NOTE — Assessment & Plan Note (Signed)
Placed on 2lpm 09/02/21  @ discharge   Advised: Make sure you check your oxygen saturation  AT  your highest level of activity (not after you stop)   to be sure it stays over 90% and adjust  02 flow upward to maintain this level if needed but remember to turn it back to previous settings when you stop (to conserve your supply).

## 2021-09-14 NOTE — Assessment & Plan Note (Signed)
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          Each maintenance medication was reviewed in detail including emphasizing most importantly the difference between maintenance and prns and under what circumstances the prns are to be triggered using an action plan format where appropriate.  Total time for H and P, chart review, counseling, reviewing hfa/dpi/02 device(s) and generating customized AVS unique to this new pt office visit / same day charting > 40 min

## 2021-09-14 NOTE — Patient Instructions (Addendum)
Make sure you check your oxygen saturation  AT  your highest level of activity (not after you stop)   to be sure it stays over 90% and adjust  02 flow upward to maintain this level if needed but remember to turn it back to previous settings when you stop (to conserve your supply).    If you are more trouble breathing especially when lying down on L side, the fluid is building back up and can be drained by radiology without going to ER if you will call me during the week but once established with oncology then they can handle it as needed   Work on inhaler technique:  relax and gently blow all the way out then take a nice smooth full deep breath back in, triggering the inhaler at same time you start breathing in.  Hold for up to 5 seconds if you can. Blow dulera out thru nose. Rinse and gargle with water when done.  If mouth or throat bother you at all,  try brushing teeth/gums/tongue with arm and hammer toothpaste/ make a slurry and gargle and spit out.

## 2021-09-14 NOTE — Progress Notes (Signed)
Cody Williams, male    DOB: 1941/02/22,    MRN: [id number]   Brief patient profile:  41  yobm  quit smoking 03/2018  referred to pulmonary clinic in [facility]  09/14/2021 by hospitalist  for dx of new malignant R Pleural effusion (adenoca)    p  Dr Jenell Milliner eval during admit:     Admit date: 08/30/2021 Discharge date: 09/02/2021     Brief/Interim Summary: Patient is a 80 year old male with history of tobacco use, hypertension, hyperlipidemia, glaucoma with right eye blindness who presented with shortness of breath, cough from home.  He lives with his brother.  Ambulatory at baseline.  He has reported weight loss since last few weeks.  On presentation he was mildly tachycardic, afebrile, normotensive.  He was saturating 91% on room air.  Chest x-ray revealed large right-sided pleural effusion.  He was also given a dose of ceftriaxone, azithromycin in the ED.  He was admitted for the management of right-sided pleural effusion with suspicion of malignancy.  Ultrasound-guided thoracentesis was done on 08/31/2021 with removal of 1.5 L of bloody fluid.  Fluid analysis revealed elevated LDH, elevated white cell count.  Cytology and culture pending.  Pulmonology was also consulted who recommend outpatient follow-up for possible bronchoscopy.  Palliative care also consulted for possibility of malignancy with poor performance status,patient wants to pursue full scope of treatment.  He will be followed by pulmonology and oncology as an outpatient.  He is medically stable for discharge today.  Following problems were addressed during his hospitalization:   Right-sided pleural effusion/Right lung mass:  Ultrasound-guided thoracentesis was done on 08/31/2021 with removal of 1.5 L of bloody fluid.  Fluid analysis revealed elevated LDH, elevated white cell count.Cytology  pos for adenoca  CT chest showed spiculated mass in the right upper lobe measuring 1.9 x 1.4 x 1.0 cm suspicious for primary malignancy. Additional  soft tissue mass of the pleura along the medial right upper lobe with direct extension into the mediastinum may be due to metastatic disease or additional site of primary pleural malignancy.Right hilar, mediastinal, and right neck lymphadenopathy consistent with metastatic disease Pulmonology was also consulted who recommend outpatient follow-up for possible bronchoscopy.  Palliative care also consulted for possibility of malignancy with poor performance status.  Oncology aware and will follow up  as an outpatient.  Discussed the case with Dr. Grayland Ormond.  He will be called oncology and pulmonology as an outpatient.   Acute respiratory failure with hypoxia/COPD: Desaturated on room air on presentation.  Currently on 2 L of oxygen per minute.  Does not use oxygen at home. He will qualify for home oxygen on discharge.  He has been started on inhalers for COPD treatment   Suspicion for superimposed pneumonia, SIRS: Presented with tachypnea, tachycardia, leukocytosis.  Blood cultures NGTD.  Started on ceftriaxone at admission.  Remains afebrile.  Sepsis physiology has improved.Abx changed to oral on discharge   Hyperlipidemia: On Lipitor 10 mg daily   Hypertension: On amlodipine 5 mg daily.  This morning blood pressure is stable.   Debility/deconditioning/malnutrition: Looks extremely deconditioned, malnourished.  History of significant weight loss.  PT/OT recommended HH on DC           History of Present Illness  09/14/2021  Pulmonary/ 1st office eval/ [name] / [employer]  Office  Chief Complaint  Patient presents with   [facility] f/u   Dyspnea:  working with PT on 2lpm room to room ? Monitoring sats Cough: none  Sleep: sleeping fine flat SABA use: not needing   No obvious day to day or daytime variability or assoc excess/ purulent sputum or mucus plugs or hemoptysis or cp or chest tightness, subjective wheeze or overt sinus or hb symptoms.   Sleeping as above without nocturnal   or early am exacerbation  of respiratory  c/o's or need for noct saba. Also denies any obvious fluctuation of symptoms with weather or environmental changes or other aggravating or alleviating factors except as outlined above   No unusual exposure hx or h/o childhood pna/ asthma or knowledge of premature birth.  Current Allergies, Complete Past Medical History, Past Surgical History, Family History, and Social History were reviewed in [employer] record.  ROS  The following are not active complaints unless bolded Hoarseness, sore throat, dysphagia, dental problems, itching, sneezing,  nasal congestion or discharge of excess mucus or purulent secretions, ear ache,   fever, chills, sweats, unintended wt loss or wt gain, classically pleuritic or exertional cp,  orthopnea pnd or arm/hand swelling  or leg swelling, presyncope, palpitations, abdominal pain, anorexia, nausea, vomiting, diarrhea  or change in bowel habits or change in bladder habits, change in stools or change in urine, dysuria, hematuria,  rash, arthralgias, visual complaints, headache, numbness, weakness or ataxia or problems with walking or coordination,  change in mood or  memory.             Past Medical History:  Diagnosis Date   Glaucoma    Hyperlipidemia    Hypertension     Outpatient Medications Prior to Visit  Medication Sig Dispense Refill   albuterol (VENTOLIN HFA) 108 (90 Base) MCG/ACT inhaler Inhale 2 puffs into the lungs every 6 (six) hours as needed for wheezing or shortness of breath. 8 g 2   amLODipine (NORVASC) 5 MG tablet take 1 tablet by mouth once daily     aspirin EC 81 MG tablet Take 81 mg by mouth daily.     atorvastatin (LIPITOR) 10 MG tablet take 1 tablet by mouth once daily     dorzolamide-timolol (COSOPT) 22.3-6.8 MG/ML ophthalmic solution 1 drop 2 (two) times daily.     mometasone-formoterol (DULERA) 200-5 MCG/ACT AERO Inhale 2 puffs into the lungs 2 (two) times daily. 13 g 1           tiotropium (SPIRIVA) 18 MCG inhalation capsule Place 1 capsule (18 mcg total) into inhaler and inhale daily. 30 capsule 1       Objective:     BP 118/62 (BP Location: Left Arm, Patient Position: Sitting, Cuff Size: Normal)   Pulse (!) 102   Temp 98.8 F (37.1 C) (Oral)   Ht 5\' 8"  (1.727 m)   Wt 112 lb 12.8 oz (51.2 kg)   SpO2 97%   BMI 17.15 kg/m   SpO2: 97 % O2 Type: Continuous O2 O2 Flow Rate (L/min): 2 L/min  W/c bound chronically ill appearing elderly thin bm nad  HEENT : pt wearing mask not removed for exam due to covid -19 concerns.    NECK :  without JVD/Nodes/TM/ nl carotid upstrokes bilaterally   LUNGS: no acc muscle use,  Mod barrel  contour chest wall with bilateral  Distant bs esp R base  with dullness and  without cough on insp or exp maneuvers and mod  Hyperresonant  to  percussion bilaterally     CV:  RRR  no s3 or murmur or increase in P2, and no edema   ABD:  soft and nontender with pos mid insp Hoover's  in the supine position. No bruits or organomegaly appreciated, bowel sounds nl  MS:     ext warm without deformities, calf tenderness, cyanosis or clubbing No obvious joint restrictions   SKIN: warm and dry without lesions    NEURO:  alert, approp, nl sensorium with  no motor or cerebellar deficits apparent.    I personally reviewed images and agree with radiology impression as follows:   Chest CT with contrast  08/31/21 1. Spiculated mass in the right upper lobe measuring 1.9 x 1.4 x 1.0 cm is suspicious for primary malignancy. Additional soft tissue mass of the pleura along the medial right upper lobe with direct extension into the mediastinum may be due to metastatic disease or additional site of primary pleural malignancy. 2. 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            Assessment   No problem-specific Assessment & Plan notes found for this encounter.     Christinia Gully, MD 09/14/2021

## 2021-09-15 DIAGNOSIS — J439 Emphysema, unspecified: Secondary | ICD-10-CM | POA: Diagnosis not present

## 2021-09-15 DIAGNOSIS — A419 Sepsis, unspecified organism: Secondary | ICD-10-CM | POA: Diagnosis not present

## 2021-09-15 DIAGNOSIS — J90 Pleural effusion, not elsewhere classified: Secondary | ICD-10-CM | POA: Diagnosis not present

## 2021-09-15 DIAGNOSIS — J9601 Acute respiratory failure with hypoxia: Secondary | ICD-10-CM | POA: Diagnosis not present

## 2021-09-15 DIAGNOSIS — K5730 Diverticulosis of large intestine without perforation or abscess without bleeding: Secondary | ICD-10-CM | POA: Diagnosis not present

## 2021-09-15 DIAGNOSIS — C3411 Malignant neoplasm of upper lobe, right bronchus or lung: Secondary | ICD-10-CM | POA: Diagnosis not present

## 2021-09-15 DIAGNOSIS — I10 Essential (primary) hypertension: Secondary | ICD-10-CM | POA: Diagnosis not present

## 2021-09-18 DIAGNOSIS — C3491 Malignant neoplasm of unspecified part of right bronchus or lung: Secondary | ICD-10-CM | POA: Insufficient documentation

## 2021-09-18 NOTE — Progress Notes (Signed)
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  INTERVAL HISTORY: Patient is a 80-year-old male who was admitted to the hospital 3 to 4 weeks ago for increasing shortness of breath and found to have the above-stated malignancy.  Patient continues to have chronic weakness and fatigue, but states this has improved since discharge.  He continues to have shortness of breath requiring oxygen, but he states this has improved as well.  He otherwise feels well.  He has no neurologic complaints.  He denies any recent fevers or illnesses.  He has a good appetite and denies weight loss.  He has no chest pain, cough, or hemoptysis.  He denies any nausea, vomiting, constipation, or diarrhea.  He has no urinary complaints.  Patient offers no further specific complaints today.  REVIEW OF SYSTEMS:   Review of Systems  Constitutional:  Positive for malaise/fatigue. Negative for fever and weight loss.  Respiratory:  Positive for shortness of breath. Negative for cough and hemoptysis.   Cardiovascular: Negative.  Negative for chest pain and leg swelling.  Gastrointestinal: Negative.  Negative for abdominal pain.  Genitourinary: Negative.  Negative for dysuria.  Musculoskeletal: Negative.  Negative for back pain.  Skin: Negative.  Negative for rash.  Neurological:  Positive for weakness. Negative for dizziness, focal weakness and headaches.  Psychiatric/Behavioral: Negative.  The patient is not nervous/anxious.    As per HPI. Otherwise, a complete review of systems is negative.  PAST MEDICAL HISTORY: Past Medical History:  Diagnosis Date   Glaucoma    Hyperlipidemia    Hypertension      PAST SURGICAL HISTORY: Past Surgical History:  Procedure Laterality Date   EYE SURGERY      FAMILY HISTORY: Family History  Problem Relation Age of Onset   Varicose Veins Neg Hx    Vision loss Neg Hx     ADVANCED DIRECTIVES (Y/N):  N  HEALTH MAINTENANCE: Social History   Tobacco Use   Smoking status: Former    Types: Cigarettes    Quit date: 04/15/2018    Years since quitting: 3.4   Smokeless tobacco: Never  Substance Use Topics   Alcohol use: Yes   Drug use: No     Colonoscopy:  PAP:  Bone density:  Lipid panel:  No Known Allergies  Current Outpatient Medications  Medication Sig Dispense Refill   albuterol (VENTOLIN HFA) 108 (90 Base) MCG/ACT inhaler Inhale 2 puffs into the lungs every 6 (six) hours as needed for wheezing or shortness of breath. 8 g 2   amLODipine (NORVASC) 5 MG tablet take 1 tablet by mouth once daily     aspirin EC 81 MG tablet Take 81 mg by mouth daily.     atorvastatin (LIPITOR) 10 MG tablet take 1 tablet by mouth once daily     dorzolamide-timolol (COSOPT) 22.3-6.8 MG/ML ophthalmic solution 1 drop 2 (two) times daily.     mometasone-formoterol (DULERA) 200-5 MCG/ACT AERO Inhale 2 puffs into the lungs 2 (two) times daily. 13 g 1   nicotine (NICODERM CQ - DOSED IN MG/24 HOURS) 21 mg/24hr patch Place 1 patch (21 mg total) onto the skin daily as needed (nicotine craving). 28 patch 0   tiotropium (SPIRIVA) 18 MCG   inhalation capsule Place 1 capsule (18 mcg total) into inhaler and inhale daily. 30 capsule 1   No current facility-administered medications for this visit.    OBJECTIVE: Vitals:   09/20/21 1118  BP: 112/61  Pulse: 98  Resp: 16  Temp: 98.6 F (37 C)  SpO2: 98%     Body mass index is 17.38 kg/m.    ECOG FS:1 - Symptomatic but completely ambulatory  General: Thin, no acute distress.  Sitting in a wheelchair. Eyes: Pink conjunctiva, anicteric sclera. HEENT: Normocephalic, moist mucous membranes. Lungs: No audible wheezing  or coughing. Heart: Regular rate and rhythm. Abdomen: Soft, nontender, no obvious distention. Musculoskeletal: No edema, cyanosis, or clubbing. Neuro: Alert, answering all questions appropriately. Cranial nerves grossly intact. Skin: No rashes or petechiae noted. Psych: Normal affect. Lymphatics: No cervical, calvicular, axillary or inguinal LAD.   LAB RESULTS:  Lab Results  Component Value Date   NA 141 08/31/2021   K 4.1 08/31/2021   CL 102 08/31/2021   CO2 29 08/31/2021   GLUCOSE 89 08/31/2021   BUN 29 (H) 08/31/2021   CREATININE 0.89 08/31/2021   CALCIUM 9.2 08/31/2021   ALBUMIN 2.2 (L) 08/31/2021   GFRNONAA >60 08/31/2021    Lab Results  Component Value Date   WBC 17.0 (H) 09/01/2021   NEUTROABS 14.5 (H) 09/01/2021   HGB 13.3 09/01/2021   HCT 43.4 09/01/2021   MCV 92.5 09/01/2021   PLT 582 (H) 09/01/2021     STUDIES: DG Chest 2 View  Addendum Date: 08/30/2021   ADDENDUM REPORT: 08/30/2021 15:42 ADDENDUM: Correction: Original findings correct. IMPRESSION: Large "RIGHT" unilateral pleural effusion. Common differential would include parapneumonic effusion versus malignancy. Consider CT thorax with contrast for further characterization. Electronically Signed   By: Cody  Williams M.D.   On: 08/30/2021 15:42   Addendum Date: 08/30/2021   ADDENDUM REPORT: 08/30/2021 15:07 ADDENDUM: Correction in the findings section, "there is a large LEFT pleural effusion which layers dependent " Electronically Signed   By: Cody  Williams M.D.   On: 08/30/2021 15:07   Result Date: 08/30/2021 CLINICAL DATA:  Short of breath EXAM: CHEST - 2 VIEW COMPARISON:  None FINDINGS: Normal cardiac silhouette. Bilateral scarring within lungs greater on the RIGHT. There is a large RIGHT pleural effusion which layers dependently. IMPRESSION: Large LEFT unilateral pleural effusion. Common differential would include parapneumonic effusion versus malignancy. Consider CT thorax with contrast for further  characterization. Electronically Signed: By: Cody  Williams M.D. On: 08/30/2021 11:12   CT CHEST W CONTRAST  Result Date: 08/31/2021 CLINICAL DATA:  79-year-old male with history of tobacco use, hypertension, hyperlipidemia, glaucoma with right eye blindness who presented with shortness of breath, cough from home. He lives with his brother. Ambulatory at baseline. He has reported weight loss since last few weeks. On presentation he was mildly tachycardic, afebrile, normotensive. He was saturating 91% on room air. Chest x-ray revealed large right-sided pleural effusion. He was also given a dose of ceftriaxone, azithromycin in the ED. He was admitted for the management of right-sided pleural effusion with suspicion of malignancy. EXAM: CT CHEST WITH CONTRAST TECHNIQUE: Multidetector CT imaging of the chest was performed during intravenous contrast administration. CONTRAST:  75mL OMNIPAQUE IOHEXOL 300 MG/ML  SOLN COMPARISON:  Chest radiograph 08/31/2021 FINDINGS: Cardiovascular: Heart size is within normal limits. Atherosclerotic calcifications seen throughout the thoracic aorta. No pulmonary artery embolism is identified. Mediastinum/Nodes: Mildly enlarged right supraclavicular lymph node is seen measuring 1.4 cm in short axis. Enlarged mediastinal lymph nodes are seen   with large is located in the right paratracheal region measuring 1.4 cm in short axis. Enlarged subcarinal lymph node is present measuring 1.4 cm in short axis as well. There are enlarged lymph nodes in the right hilar region consistent with metastatic disease. Lungs/Pleura: Masslike thickening of the pleura of the right medial upper lung with direct extension into the mediastinum measuring approximately 4.1 x 3.5 x 2.9 cm is consistent with malignancy. He a spiculated mass is also seen in the right upper lobe on image 36 of series 3 which measures 1.9 x 1.4 x 1.0 cm. There are advanced emphysematous changes of the lungs. Minimal residual right  pleural effusion. Bandlike opacity at the left lung apex likely due to scarring. Upper Abdomen: No acute abnormality. Musculoskeletal: No chest wall abnormality. No acute or significant osseous findings. IMPRESSION: 1. Spiculated mass in the right upper lobe measuring 1.9 x 1.4 x 1.0 cm is suspicious for primary malignancy. Additional soft tissue mass of the pleura along the medial right upper lobe with direct extension into the mediastinum may be due to metastatic disease or additional site of primary pleural malignancy. 2. 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 Aortic Atherosclerosis (ICD10-I70.0) and Emphysema (ICD10-J43.9). Electronically Signed   By: Miachel Roux M.D.   On: 08/31/2021 13:39   MR BRAIN W WO CONTRAST  Result Date: 09/01/2021 CLINICAL DATA:  Metastatic disease evaluation EXAM: MRI HEAD WITHOUT AND WITH CONTRAST TECHNIQUE: Multiplanar, multiecho pulse sequences of the brain and surrounding structures were obtained without and with intravenous contrast. CONTRAST:  7m GADAVIST GADOBUTROL 1 MMOL/ML IV SOLN COMPARISON:  None. FINDINGS: Brain: No acute infarct, mass effect or extra-axial collection. No acute or chronic hemorrhage. There is multifocal hyperintense T2-weighted signal within the white matter. Generalized volume loss without a clear lobar predilection. the midline structures are normal. Vascular: Abnormal right ICA flow void, possibly occlusion or slow flow. Skull and upper cervical spine: Normal calvarium and skull base. Visualized upper cervical spine and soft tissues are normal. Sinuses/Orbits:No paranasal sinus fluid levels or advanced mucosal thickening. No mastoid or middle ear effusion. Normal orbits. IMPRESSION: 1. No intracranial metastatic disease. 2. Abnormal right ICA flow void, possibly occlusion or slow flow. Electronically Signed   By: KUlyses JarredM.D.   On: 09/01/2021 23:24   CT ABDOMEN PELVIS W  CONTRAST  Result Date: 09/01/2021 CLINICAL DATA:  Metastatic disease evaluation. Right upper lobe lung mass. EXAM: CT ABDOMEN AND PELVIS WITH CONTRAST TECHNIQUE: Multidetector CT imaging of the abdomen and pelvis was performed using the standard protocol following bolus administration of intravenous contrast. CONTRAST:  722mOMNIPAQUE IOHEXOL 300 MG/ML  SOLN COMPARISON:  Chest CT dated 08/31/2021. FINDINGS: Lower chest: Partially visualized right pleural effusion with small pockets of pleural air. Background of emphysema. Diffuse interstitial coarsening of the right lung. Thickened and nodular appearance of the right pleural surface. Please refer to the report for the chest CT of 08/31/2021 for lung findings. No intra-abdominal free air or free fluid. Hepatobiliary: The liver is unremarkable. No intrahepatic biliary dilatation. The gallbladder is contracted no calcified gallstone. Pancreas: Unremarkable. No pancreatic ductal dilatation or surrounding inflammatory changes. Spleen: Normal in size without focal abnormality. Adrenals/Urinary Tract: The adrenal glands are unremarkable. The kidneys, visualized ureters, and urinary bladder appear unremarkable. Stomach/Bowel: There is no bowel obstruction or active inflammation. The appendix is normal. There is sigmoid diverticulosis without active inflammatory changes. Vascular/Lymphatic: Advanced aortoiliac atherosclerotic disease. The IVC is unremarkable. No portal venous gas. There is  no adenopathy. Reproductive: The prostate and seminal vesicles are grossly unremarkable. No pelvic mass. Other: None Musculoskeletal: Degenerative changes of the lower lumbar spine. No acute osseous pathology. IMPRESSION: 1. No acute intra-abdominal or pelvic pathology. No evidence of metastatic disease in the abdomen or pelvis. 2. Sigmoid diverticulosis. 3. Partially visualized right pleural effusion with small pockets of pleural air. 4. Thickened and nodular appearance of the right  pleural surface. 5. Aortic Atherosclerosis (ICD10-I70.0) and Emphysema (ICD10-J43.9). Electronically Signed   By: Anner Crete M.D.   On: 09/01/2021 21:01   DG Chest Port 1 View  Result Date: 08/31/2021 CLINICAL DATA:  Post right thoracentesis EXAM: PORTABLE CHEST 1 VIEW COMPARISON:  08/30/2021 FINDINGS: Decreased right pleural effusion which is now small. Airspace disease is present throughout the right lung. No pneumothorax Hyperinflation left lung. Apical linear density is unchanged likely scarring. Left lung base clear. No left effusion IMPRESSION: No complication post right thoracentesis. Electronically Signed   By: Franchot Gallo M.D.   On: 08/31/2021 12:32   US THORACENTESIS ASP PLEURAL SPACE W/IMG GUIDE  Result Date: 08/31/2021 CLINICAL DATA:  Right pleural effusion. EXAM: ULTRASOUND GUIDED RIGHT THORACENTESIS COMPARISON:  None. PROCEDURE: An ultrasound guided thoracentesis was thoroughly discussed with the patient and questions answered. The benefits, risks, alternatives and complications were also discussed. The patient understands and wishes to proceed with the procedure. Written consent was obtained. Ultrasound was performed to localize and mark an adequate pocket of fluid in the right chest. The area was then prepped and draped in the normal sterile fashion. 1% Lidocaine was used for local anesthesia. Under ultrasound guidance a 6 French Safe-T-Centesis catheter was introduced. Thoracentesis was performed. The catheter was removed and a dressing applied. COMPLICATIONS: None FINDINGS: A total of approximately 1.5 L of bloody fluid was removed. A fluid sample was sent for laboratory analysis. IMPRESSION: Successful ultrasound guided right thoracentesis yielding 1.5 L of pleural fluid. Electronically Signed   By: Aletta Edouard M.D.   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  PLAN:    Stage IVA  adenocarcinoma of the right upper lobe lung with malignant pleural effusion: CT scan results from August 31, 2021 reviewed independently and reported as above with a right upper lobe spiculated mass measuring 1.9 cm.  Patient also noted to have right hilar, mediastinal, and right neck lymphadenopathy.    Thoracentesis on August 31, 2021 confirmed adenocarcinoma, likely of lung origin.  MRI of the brain on September 01, 2021 did not reveal any metastatic disease.  Patient's performance status has improved and he wishes to pursue systemic chemotherapy, possibly with carboplatinum, Taxol, and Avastin.  Case discussed with pathology and there is enough tissue for PD-L1 testing which is pending at time of dictation.  Prior to initiating treatment, patient will require a PET scan as well as a port.  Return to clinic in 1 to 2 weeks after his PET scan to discuss the results and treatment planning.  Appreciate palliative care input. Shortness of breath: Continue oxygen as prescribed.  Patient reports he has a therapist and plans to assess his pulse ox at home without oxygen.  I spent a total of 60 minutes reviewing chart data, face-to-face evaluation with the patient, counseling and coordination of care as detailed above.   Patient expressed understanding and was in agreement with this plan. He also understands that He can call clinic at any time with any questions, concerns, or  complaints.    Cancer Staging  Adenocarcinoma of right lung (HCC) Staging form: Lung, AJCC 8th Edition - Clinical: Stage IVA (cT2, cN2, cM1a) - Signed by ,  J, MD on 09/18/2021   J , MD   09/20/2021 1:02 PM     

## 2021-09-19 ENCOUNTER — Encounter: Payer: Self-pay | Admitting: *Deleted

## 2021-09-19 NOTE — Progress Notes (Signed)
Attempted to contact pt to introduce to navigator services and assess for any needs/barriers prior to new patient appt tomorrow. Pt did not answer and unable to leave message. Will leave information with clinical team to give to patient at his visit tomorrow.

## 2021-09-20 ENCOUNTER — Telehealth: Payer: Self-pay | Admitting: Emergency Medicine

## 2021-09-20 ENCOUNTER — Inpatient Hospital Stay: Payer: Medicare HMO | Attending: Oncology | Admitting: Oncology

## 2021-09-20 ENCOUNTER — Other Ambulatory Visit: Payer: Self-pay

## 2021-09-20 ENCOUNTER — Inpatient Hospital Stay (HOSPITAL_BASED_OUTPATIENT_CLINIC_OR_DEPARTMENT_OTHER): Payer: Medicare HMO | Admitting: Hospice and Palliative Medicine

## 2021-09-20 ENCOUNTER — Inpatient Hospital Stay: Payer: Medicare HMO

## 2021-09-20 DIAGNOSIS — C3491 Malignant neoplasm of unspecified part of right bronchus or lung: Secondary | ICD-10-CM | POA: Diagnosis not present

## 2021-09-20 DIAGNOSIS — Z515 Encounter for palliative care: Secondary | ICD-10-CM

## 2021-09-20 DIAGNOSIS — J910 Malignant pleural effusion: Secondary | ICD-10-CM | POA: Diagnosis not present

## 2021-09-20 DIAGNOSIS — R0602 Shortness of breath: Secondary | ICD-10-CM

## 2021-09-20 DIAGNOSIS — Z87891 Personal history of nicotine dependence: Secondary | ICD-10-CM

## 2021-09-20 DIAGNOSIS — C3411 Malignant neoplasm of upper lobe, right bronchus or lung: Secondary | ICD-10-CM

## 2021-09-20 DIAGNOSIS — Z9981 Dependence on supplemental oxygen: Secondary | ICD-10-CM | POA: Diagnosis not present

## 2021-09-20 NOTE — Telephone Encounter (Signed)
PDL1 request faxed to pathology

## 2021-09-20 NOTE — Progress Notes (Signed)
[place]  [phone] [phone]   Name: Cody Williams Date: 09/20/2021 MRN: [id number]  DOB: 06/17/1941  Patient Care Team: Baxter Hire, MD as PCP - General (Internal Medicine) Telford Nab, RN as Oncology Nurse Navigator    REASON FOR CONSULTATION: Cody Williams is a 80 y.o. male with multiple medical problems including hypertension, hyperlipidemia, glaucoma with right eye blindness, and chronic tobacco use, who was hospitalized 08/30/2021 to 09/02/2021 with shortness of breath.  CT of the chest revealed a right upper lobe spiculated mass with extension into the mediastinum and right hilar/mediastinal/right neck lymphadenopathy concerning for metastatic disease.  Palliative care was consulted up address goals.   SOCIAL HISTORY:     reports that he quit smoking about 3 years ago. His smoking use included cigarettes. He has never used smokeless tobacco. He reports current alcohol use. He reports that he does not use drugs.  Patient is divorced.  He lives at home with a brother.  Patient sister is his primary caregiver.He has 5 children but says that they are not very involved in his life.  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  MEDICATIONS:  Current Outpatient Medications   Medication Sig Dispense Refill   albuterol (VENTOLIN HFA) 108 (90 Base) MCG/ACT inhaler Inhale 2 puffs into the lungs every 6 (six) hours as needed for wheezing or shortness of breath. 8 g 2   amLODipine (NORVASC) 5 MG tablet take 1 tablet by mouth once daily     aspirin EC 81 MG tablet Take 81 mg by mouth daily.     atorvastatin (LIPITOR) 10 MG tablet take 1 tablet by mouth once daily     dorzolamide-timolol (COSOPT) 22.3-6.8 MG/ML ophthalmic solution 1 drop 2 (two) times daily.     mometasone-formoterol (DULERA) 200-5 MCG/ACT AERO Inhale 2 puffs into the lungs 2 (two) times daily. 13 g 1   nicotine (NICODERM CQ - DOSED IN MG/24 HOURS) 21 mg/24hr patch Place 1 patch (21 mg total) onto the skin daily as needed (nicotine craving). 28 patch 0   tiotropium (SPIRIVA) 18 MCG inhalation capsule Place 1 capsule (18 mcg total) into inhaler and inhale daily. 30 capsule 1   No current facility-administered medications for this visit.    VITAL SIGNS: There were no vitals taken for this visit. There were no vitals filed for this visit.  Estimated body mass index is 17.38 kg/m as calculated from the following:   Height as of 09/14/21: 5\' 8"  (1.727 m).   Weight as of an earlier encounter on 09/20/21: 114 lb 4.8 oz (51.8 kg).  LABS: CBC:    Component Value Date/Time   WBC 17.0 (H) 09/01/2021 0652   HGB 13.3 09/01/2021 0652   HCT 43.4 09/01/2021 9629  PLT 582 (H) 09/01/2021 0652   MCV 92.5 09/01/2021 0652   NEUTROABS 14.5 (H) 09/01/2021 0652   LYMPHSABS 0.8 09/01/2021 0652   MONOABS 1.5 (H) 09/01/2021 0652   EOSABS 0.1 09/01/2021 0652   BASOSABS 0.0 09/01/2021 0652   Comprehensive Metabolic Panel:    Component Value Date/Time   NA 141 08/31/2021 0029   K 4.1 08/31/2021 0029   CL 102 08/31/2021 0029   CO2 29 08/31/2021 0029   BUN 29 (H) 08/31/2021 0029   CREATININE 0.89 08/31/2021 0029   GLUCOSE 89 08/31/2021 0029   CALCIUM 9.2 08/31/2021 0029   ALBUMIN 2.2 (L) 08/31/2021 0029     RADIOGRAPHIC STUDIES: DG Chest 2 View  Addendum Date: 08/30/2021   ADDENDUM REPORT: 08/30/2021 15:42 ADDENDUM: Correction: Original findings correct. IMPRESSION: Large "RIGHT" unilateral pleural effusion. Common differential would include parapneumonic effusion versus malignancy. Consider CT thorax with contrast for further characterization. Electronically Signed   By: Suzy Bouchard M.D.   On: 08/30/2021 15:42   Addendum Date: 08/30/2021   ADDENDUM REPORT: 08/30/2021 15:07 ADDENDUM: Correction in the findings section, "there is a large LEFT pleural effusion which layers dependent " Electronically Signed   By: Suzy Bouchard M.D.   On: 08/30/2021 15:07   Result Date: 08/30/2021 CLINICAL DATA:  Short of breath EXAM: CHEST - 2 VIEW COMPARISON:  None FINDINGS: Normal cardiac silhouette. Bilateral scarring within lungs greater on the RIGHT. There is a large RIGHT pleural effusion which layers dependently. IMPRESSION: Large LEFT unilateral pleural effusion. Common differential would include parapneumonic effusion versus malignancy. Consider CT thorax with contrast for further characterization. Electronically Signed: By: Suzy Bouchard M.D. On: 08/30/2021 11:12   CT CHEST W CONTRAST  Result Date: 08/31/2021 CLINICAL DATA:  80 year old male with history of tobacco use, hypertension, hyperlipidemia, glaucoma with right eye blindness who presented with shortness of breath, cough from home. He lives with his brother. Ambulatory at baseline. He has reported weight loss since last few weeks. On presentation he was mildly tachycardic, afebrile, normotensive. He was saturating 91% on room air. Chest x-ray revealed large right-sided pleural effusion. He was also given a dose of ceftriaxone, azithromycin in the ED. He was admitted for the management of right-sided pleural effusion with suspicion of malignancy. EXAM: CT CHEST WITH CONTRAST TECHNIQUE: Multidetector CT imaging of the chest was performed during  intravenous contrast administration. CONTRAST:  57mL OMNIPAQUE IOHEXOL 300 MG/ML  SOLN COMPARISON:  Chest radiograph 08/31/2021 FINDINGS: Cardiovascular: Heart size is within normal limits. Atherosclerotic calcifications seen throughout the thoracic aorta. No pulmonary artery embolism is identified. Mediastinum/Nodes: Mildly enlarged right supraclavicular lymph node is seen measuring 1.4 cm in short axis. Enlarged mediastinal lymph nodes are seen with large is located in the right paratracheal region measuring 1.4 cm in short axis. Enlarged subcarinal lymph node is present measuring 1.4 cm in short axis as well. There are enlarged lymph nodes in the right hilar region consistent with metastatic disease. Lungs/Pleura: Masslike thickening of the pleura of the right medial upper lung with direct extension into the mediastinum measuring approximately 4.1 x 3.5 x 2.9 cm is consistent with malignancy. He a spiculated mass is also seen in the right upper lobe on image 36 of series 3 which measures 1.9 x 1.4 x 1.0 cm. There are advanced emphysematous changes of the lungs. Minimal residual right pleural effusion. Bandlike opacity at the left lung apex likely due to scarring. Upper Abdomen: No acute abnormality. Musculoskeletal: No chest wall abnormality. No acute or significant osseous  findings. IMPRESSION: 1. Spiculated mass in the right upper lobe measuring 1.9 x 1.4 x 1.0 cm is suspicious for primary malignancy. Additional soft tissue mass of the pleura along the medial right upper lobe with direct extension into the mediastinum may be due to metastatic disease or additional site of primary pleural malignancy. 2. 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 Aortic Atherosclerosis (ICD10-I70.0) and Emphysema (ICD10-J43.9). Electronically Signed   By: Miachel Roux M.D.   On: 08/31/2021 13:39   MR BRAIN W WO CONTRAST  Result Date: 09/01/2021 CLINICAL DATA:  Metastatic  disease evaluation EXAM: MRI HEAD WITHOUT AND WITH CONTRAST TECHNIQUE: Multiplanar, multiecho pulse sequences of the brain and surrounding structures were obtained without and with intravenous contrast. CONTRAST:  63mL GADAVIST GADOBUTROL 1 MMOL/ML IV SOLN COMPARISON:  None. FINDINGS: Brain: No acute infarct, mass effect or extra-axial collection. No acute or chronic hemorrhage. There is multifocal hyperintense T2-weighted signal within the white matter. Generalized volume loss without a clear lobar predilection. the midline structures are normal. Vascular: Abnormal right ICA flow void, possibly occlusion or slow flow. Skull and upper cervical spine: Normal calvarium and skull base. Visualized upper cervical spine and soft tissues are normal. Sinuses/Orbits:No paranasal sinus fluid levels or advanced mucosal thickening. No mastoid or middle ear effusion. Normal orbits. IMPRESSION: 1. No intracranial metastatic disease. 2. Abnormal right ICA flow void, possibly occlusion or slow flow. Electronically Signed   By: Ulyses Jarred M.D.   On: 09/01/2021 23:24   CT ABDOMEN PELVIS W CONTRAST  Result Date: 09/01/2021 CLINICAL DATA:  Metastatic disease evaluation. Right upper lobe lung mass. EXAM: CT ABDOMEN AND PELVIS WITH CONTRAST TECHNIQUE: Multidetector CT imaging of the abdomen and pelvis was performed using the standard protocol following bolus administration of intravenous contrast. CONTRAST:  67mL OMNIPAQUE IOHEXOL 300 MG/ML  SOLN COMPARISON:  Chest CT dated 08/31/2021. FINDINGS: Lower chest: Partially visualized right pleural effusion with small pockets of pleural air. Background of emphysema. Diffuse interstitial coarsening of the right lung. Thickened and nodular appearance of the right pleural surface. Please refer to the report for the chest CT of 08/31/2021 for lung findings. No intra-abdominal free air or free fluid. Hepatobiliary: The liver is unremarkable. No intrahepatic biliary dilatation. The gallbladder  is contracted no calcified gallstone. Pancreas: Unremarkable. No pancreatic ductal dilatation or surrounding inflammatory changes. Spleen: Normal in size without focal abnormality. Adrenals/Urinary Tract: The adrenal glands are unremarkable. The kidneys, visualized ureters, and urinary bladder appear unremarkable. Stomach/Bowel: There is no bowel obstruction or active inflammation. The appendix is normal. There is sigmoid diverticulosis without active inflammatory changes. Vascular/Lymphatic: Advanced aortoiliac atherosclerotic disease. The IVC is unremarkable. No portal venous gas. There is no adenopathy. Reproductive: The prostate and seminal vesicles are grossly unremarkable. No pelvic mass. Other: None Musculoskeletal: Degenerative changes of the lower lumbar spine. No acute osseous pathology. IMPRESSION: 1. No acute intra-abdominal or pelvic pathology. No evidence of metastatic disease in the abdomen or pelvis. 2. Sigmoid diverticulosis. 3. Partially visualized right pleural effusion with small pockets of pleural air. 4. Thickened and nodular appearance of the right pleural surface. 5. Aortic Atherosclerosis (ICD10-I70.0) and Emphysema (ICD10-J43.9). Electronically Signed   By: Anner Crete M.D.   On: 09/01/2021 21:01   DG Chest Port 1 View  Result Date: 08/31/2021 CLINICAL DATA:  Post right thoracentesis EXAM: PORTABLE CHEST 1 VIEW COMPARISON:  08/30/2021 FINDINGS: Decreased right pleural effusion which is now small. Airspace disease is present throughout the right lung. No pneumothorax Hyperinflation left  lung. Apical linear density is unchanged likely scarring. Left lung base clear. No left effusion IMPRESSION: No complication post right thoracentesis. Electronically Signed   By: Franchot Gallo M.D.   On: 08/31/2021 12:32   US THORACENTESIS ASP PLEURAL SPACE W/IMG GUIDE  Result Date: 08/31/2021 CLINICAL DATA:  Right pleural effusion. EXAM: ULTRASOUND GUIDED RIGHT THORACENTESIS COMPARISON:  None.  PROCEDURE: An ultrasound guided thoracentesis was thoroughly discussed with the patient and questions answered. The benefits, risks, alternatives and complications were also discussed. The patient understands and wishes to proceed with the procedure. Written consent was obtained. Ultrasound was performed to localize and mark an adequate pocket of fluid in the right chest. The area was then prepped and draped in the normal sterile fashion. 1% Lidocaine was used for local anesthesia. Under ultrasound guidance a 6 French Safe-T-Centesis catheter was introduced. Thoracentesis was performed. The catheter was removed and a dressing applied. COMPLICATIONS: None FINDINGS: A total of approximately 1.5 L of bloody fluid was removed. A fluid sample was sent for laboratory analysis. IMPRESSION: Successful ultrasound guided right thoracentesis yielding 1.5 L of pleural fluid. Electronically Signed   By: Aletta Edouard M.D.   On: 08/31/2021 13:18    PERFORMANCE STATUS (ECOG) : 2 - Symptomatic, <50% confined to bed  Review of Systems Unless otherwise noted, a complete review of systems is negative.  Physical Exam General: NAD Pulmonary: Unlabored, on O2 Extremities: no edema, no joint deformities Skin: no rashes Neurological: Weakness but otherwise nonfocal  IMPRESSION: Post hospital follow-up.  Patient saw Dr. Grayland Ormond today and is interested in pursuing chemotherapy.  He is pending PET/port placement.  Patient reports that he is doing better since discharging home from the hospital.  However, he is not yet back to baseline.  He is receiving home health PT and has had slow improvement in functioning.  He ambulates with use of a walker and has been reliant on O2.  Patient is interested in weaning O2 if possible.  Patient denies distressing symptomatic concerns at present.  Patient reports that he had a longer complete advance directives some years back.  His sister is his healthcare power of attorney.  I  requested that he bring in documentation to the clinic.  I sent him home with a MOST form.  We discussed the probable futile nature of resuscitative efforts in the setting of advanced cancer and I encouraged family to consider decision-making.  PLAN: -Continue current scope of treatment -Continue home health -Referrals to nutrition and community palliative care -Family to bring in ACP documents -RTC 2 to 3 weeks  Case and plan discussed with Dr. Grayland Ormond  Patient expressed understanding and was in agreement with this plan. He also understands that He can call the clinic at any time with any questions, concerns, or complaints.     Time Total: 15 minutes  Visit consisted of counseling and education dealing with the complex and emotionally intense issues of symptom management and palliative care in the setting of serious and potentially life-threatening illness.Greater than 50%  of this time was spent counseling and coordinating care related to the above assessment and plan.  Signed by: Altha Harm, PhD, NP-C

## 2021-09-20 NOTE — Telephone Encounter (Signed)
Pt informed of appt for port placement on 09/26/2021 at 9am. Pt and sister(transporation) verbalized understanding to be at [facility] at 8am on this date.

## 2021-09-20 NOTE — Progress Notes (Signed)
Pt wanting to know treatment plan etc.

## 2021-09-20 NOTE — Progress Notes (Signed)
Patient on schedule for Port placement 09/26/2021, called and spoke with patients sister/Elsie on phone with pre procedure instructions given,since she will be here with him. Made aware to be here @ 0800, NPO after Mn, as well as driver post procedure/recovery/discharge. Stated understanding.

## 2021-09-25 ENCOUNTER — Other Ambulatory Visit: Payer: Self-pay | Admitting: Radiology

## 2021-09-25 ENCOUNTER — Encounter: Payer: Self-pay | Admitting: Oncology

## 2021-09-25 ENCOUNTER — Encounter: Payer: Self-pay | Admitting: Pathology

## 2021-09-26 ENCOUNTER — Encounter: Payer: Self-pay | Admitting: Radiology

## 2021-09-26 ENCOUNTER — Ambulatory Visit
Admission: RE | Admit: 2021-09-26 | Discharge: 2021-09-26 | Disposition: A | Discharge status: Home / Self Care | Payer: Medicare HMO | Source: Ambulatory Visit | Attending: Oncology | Admitting: Oncology

## 2021-09-26 ENCOUNTER — Telehealth: Payer: Self-pay | Admitting: Nurse Practitioner

## 2021-09-26 DIAGNOSIS — Z87891 Personal history of nicotine dependence: Secondary | ICD-10-CM | POA: Insufficient documentation

## 2021-09-26 DIAGNOSIS — C3411 Malignant neoplasm of upper lobe, right bronchus or lung: Secondary | ICD-10-CM | POA: Diagnosis not present

## 2021-09-26 DIAGNOSIS — I10 Essential (primary) hypertension: Secondary | ICD-10-CM | POA: Diagnosis not present

## 2021-09-26 DIAGNOSIS — H409 Unspecified glaucoma: Secondary | ICD-10-CM | POA: Diagnosis not present

## 2021-09-26 DIAGNOSIS — Z452 Encounter for adjustment and management of vascular access device: Secondary | ICD-10-CM | POA: Diagnosis not present

## 2021-09-26 DIAGNOSIS — C3490 Malignant neoplasm of unspecified part of unspecified bronchus or lung: Secondary | ICD-10-CM | POA: Diagnosis not present

## 2021-09-26 DIAGNOSIS — C3491 Malignant neoplasm of unspecified part of right bronchus or lung: Secondary | ICD-10-CM

## 2021-09-26 HISTORY — PX: IR IMAGING GUIDED PORT INSERTION: IMG5740

## 2021-09-26 MED ORDER — LIDOCAINE-EPINEPHRINE 1 %-1:100000 IJ SOLN
INTRAMUSCULAR | Status: DC | PRN
  Administered 2021-09-26: 20 mL via INTRADERMAL

## 2021-09-26 MED ORDER — LIDOCAINE-EPINEPHRINE 1 %-1:100000 IJ SOLN
INTRAMUSCULAR | Status: AC
  Filled 2021-09-26: qty 1

## 2021-09-26 MED ORDER — HEPARIN SOD (PORK) LOCK FLUSH 100 UNIT/ML IV SOLN
INTRAVENOUS | Status: DC | PRN
  Administered 2021-09-26: 500 [IU] via INTRAVENOUS

## 2021-09-26 MED ORDER — HEPARIN SOD (PORK) LOCK FLUSH 100 UNIT/ML IV SOLN
INTRAVENOUS | Status: AC
  Filled 2021-09-26: qty 5

## 2021-09-26 MED ORDER — SODIUM CHLORIDE 0.9 % IV SOLN
INTRAVENOUS | Status: DC
  Filled 2021-09-26: qty 1000

## 2021-09-26 MED ORDER — FENTANYL CITRATE (PF) 100 MCG/2ML IJ SOLN
INTRAMUSCULAR | Status: DC | PRN
  Administered 2021-09-26 (×2): 25 ug via INTRAVENOUS

## 2021-09-26 MED ORDER — MIDAZOLAM HCL 2 MG/2ML IJ SOLN
INTRAMUSCULAR | Status: AC
  Filled 2021-09-26: qty 2

## 2021-09-26 MED ORDER — FENTANYL CITRATE (PF) 100 MCG/2ML IJ SOLN
INTRAMUSCULAR | Status: AC
  Filled 2021-09-26: qty 2

## 2021-09-26 MED ORDER — MIDAZOLAM HCL 2 MG/2ML IJ SOLN
INTRAMUSCULAR | Status: DC | PRN
  Administered 2021-09-26: .5 mg via INTRAVENOUS

## 2021-09-26 NOTE — Telephone Encounter (Signed)
Spoke with patient's sister Doylene Bode, regarding the Palliative referral/services and after discussing this and all questions were answered she was in agreement with scheduling visit.  I have scheduled an In-home Consult for 10/17/21 @ 9 AM.

## 2021-09-26 NOTE — H&P (Signed)
Chief Complaint: Patient was seen in consultation today for port-a-catheter placement   Referring Physician(s): Finnegan,Timothy J  Supervising Physician: Juliet Rude  Patient Status: [facility] - Out-pt  History of Present Illness: TREMONT GAVITT is a 80 y.o. 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 He presented to the hospital in early November with worsening shortness of breath and work up was positive for lung cancer. He is familiar to IR from a thoracentesis on 08/31/21. His oncology team is preparing him for chemotherapy.   Interventional Radiology has been asked to evaluate this patient for an image-guided port-a-catheter placement to facilitate his treatment plans.   Past Medical History:  Diagnosis Date   Glaucoma    Hyperlipidemia    Hypertension     Past Surgical History:  Procedure Laterality Date   EYE SURGERY      Allergies: Patient has no known allergies.  Medications: Prior to Admission medications   Medication Sig Start Date End Date Taking? Authorizing Provider  amLODipine (NORVASC) 5 MG tablet take 1 tablet by mouth once daily 03/05/17  Yes [provider]  aspirin EC 81 MG tablet Take 81 mg by mouth daily.   Yes [provider]  atorvastatin (LIPITOR) 10 MG tablet take 1 tablet by mouth once daily 03/05/17  Yes [provider]  dorzolamide-timolol (COSOPT) 22.3-6.8 MG/ML ophthalmic solution 1 drop 2 (two) times daily. 06/23/21  Yes [provider]  mometasone-formoterol (DULERA) 200-5 MCG/ACT AERO Inhale 2 puffs into the lungs 2 (two) times daily. 09/02/21  Yes Shelly Coss, MD  nicotine (NICODERM CQ - DOSED IN MG/24 HOURS) 21 mg/24hr patch Place 1 patch (21 mg total) onto the skin daily as needed (nicotine craving). 09/02/21  Yes Shelly Coss, MD  tiotropium (SPIRIVA) 18 MCG inhalation capsule Place 1 capsule (18 mcg total) into  inhaler and inhale daily. 09/03/21  Yes Shelly Coss, MD  albuterol (VENTOLIN HFA) 108 (90 Base) MCG/ACT inhaler Inhale 2 puffs into the lungs every 6 (six) hours as needed for wheezing or shortness of breath. 09/02/21   Shelly Coss, MD     Family History  Problem Relation Age of Onset   Varicose Veins Neg Hx    Vision loss Neg Hx     Social History   Socioeconomic History   Marital status: Unknown    Spouse name: Not on file   Number of children: Not on file   Years of education: Not on file   Highest education level: Not on file  Occupational History   Not on file  Tobacco Use   Smoking status: Former    Types: Cigarettes    Quit date: 04/15/2018    Years since quitting: 3.4   Smokeless tobacco: Never  Substance and Sexual Activity   Alcohol use: Yes   Drug use: No   Sexual activity: Not on file  Other Topics Concern   Not on file  Social History Narrative   Not on file   Social Determinants of Health   Financial Resource Strain: Not on file  Food Insecurity: Not on file  Transportation Needs: Not on file  Physical Activity: Not on file  Stress: Not on file  Social Connections: Not on file    Review of Systems: A 12 point ROS discussed and pertinent positives are indicated in the HPI above.  All other systems are negative.  Review of Systems  Constitutional:  Positive for  appetite change and fatigue.  Respiratory:  Positive for shortness of breath. Negative for cough.   Cardiovascular:  Negative for chest pain and leg swelling.  Gastrointestinal:  Negative for abdominal pain, diarrhea, nausea and vomiting.  Neurological:  Negative for dizziness and headaches.   Vital Signs: BP (!) 165/67   Pulse (!) 113   Temp 98 F (36.7 C) (Oral)   Resp (!) 33   Ht 5\' 8"  (1.727 m)   Wt 108 lb (49 kg)   SpO2 92%   BMI 16.42 kg/m   Physical Exam Constitutional:      Appearance: He is underweight.  HENT:     Mouth/Throat:     Mouth: Mucous membranes are  moist.     Pharynx: Oropharynx is clear.  Cardiovascular:     Rate and Rhythm: Normal rate and regular rhythm.     Pulses: Normal pulses.     Heart sounds: Normal heart sounds.  Pulmonary:     Breath sounds: Decreased air movement present.     Comments: Shallow respirations. 2L home O2  Abdominal:     General: Bowel sounds are normal.     Palpations: Abdomen is soft.     Tenderness: There is no abdominal tenderness.  Musculoskeletal:     Right lower leg: No edema.     Left lower leg: No edema.  Skin:    General: Skin is warm and dry.  Neurological:     Mental Status: He is alert and oriented to person, place, and time.    Imaging: DG Chest 2 View  Addendum Date: 08/30/2021   ADDENDUM REPORT: 08/30/2021 15:42 ADDENDUM: Correction: Original findings correct. IMPRESSION: Large "RIGHT" unilateral pleural effusion. Common differential would include parapneumonic effusion versus malignancy. Consider CT thorax with contrast for further characterization. Electronically Signed   By: Suzy Bouchard M.D.   On: 08/30/2021 15:42   Addendum Date: 08/30/2021   ADDENDUM REPORT: 08/30/2021 15:07 ADDENDUM: Correction in the findings section, "there is a large LEFT pleural effusion which layers dependent " Electronically Signed   By: Suzy Bouchard M.D.   On: 08/30/2021 15:07   Result Date: 08/30/2021 CLINICAL DATA:  Short of breath EXAM: CHEST - 2 VIEW COMPARISON:  None FINDINGS: Normal cardiac silhouette. Bilateral scarring within lungs greater on the RIGHT. There is a large RIGHT pleural effusion which layers dependently. IMPRESSION: Large LEFT unilateral pleural effusion. Common differential would include parapneumonic effusion versus malignancy. Consider CT thorax with contrast for further characterization. Electronically Signed: By: Suzy Bouchard M.D. On: 08/30/2021 11:12   CT CHEST W CONTRAST  Result Date: 08/31/2021 CLINICAL DATA:  80 year old male with history of tobacco use,  hypertension, hyperlipidemia, glaucoma with right eye blindness who presented with shortness of breath, cough from home. He lives with his brother. Ambulatory at baseline. He has reported weight loss since last few weeks. On presentation he was mildly tachycardic, afebrile, normotensive. He was saturating 91% on room air. Chest x-ray revealed large right-sided pleural effusion. He was also given a dose of ceftriaxone, azithromycin in the ED. He was admitted for the management of right-sided pleural effusion with suspicion of malignancy. EXAM: CT CHEST WITH CONTRAST TECHNIQUE: Multidetector CT imaging of the chest was performed during intravenous contrast administration. CONTRAST:  50mL OMNIPAQUE IOHEXOL 300 MG/ML  SOLN COMPARISON:  Chest radiograph 08/31/2021 FINDINGS: Cardiovascular: Heart size is within normal limits. Atherosclerotic calcifications seen throughout the thoracic aorta. No pulmonary artery embolism is identified. Mediastinum/Nodes: Mildly enlarged right supraclavicular lymph node is seen  measuring 1.4 cm in short axis. Enlarged mediastinal lymph nodes are seen with large is located in the right paratracheal region measuring 1.4 cm in short axis. Enlarged subcarinal lymph node is present measuring 1.4 cm in short axis as well. There are enlarged lymph nodes in the right hilar region consistent with metastatic disease. Lungs/Pleura: Masslike thickening of the pleura of the right medial upper lung with direct extension into the mediastinum measuring approximately 4.1 x 3.5 x 2.9 cm is consistent with malignancy. He a spiculated mass is also seen in the right upper lobe on image 36 of series 3 which measures 1.9 x 1.4 x 1.0 cm. There are advanced emphysematous changes of the lungs. Minimal residual right pleural effusion. Bandlike opacity at the left lung apex likely due to scarring. Upper Abdomen: No acute abnormality. Musculoskeletal: No chest wall abnormality. No acute or significant osseous findings.  IMPRESSION: 1. Spiculated mass in the right upper lobe measuring 1.9 x 1.4 x 1.0 cm is suspicious for primary malignancy. Additional soft tissue mass of the pleura along the medial right upper lobe with direct extension into the mediastinum may be due to metastatic disease or additional site of primary pleural malignancy. 2. 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 Aortic Atherosclerosis (ICD10-I70.0) and Emphysema (ICD10-J43.9). Electronically Signed   By: Miachel Roux M.D.   On: 08/31/2021 13:39   MR BRAIN W WO CONTRAST  Result Date: 09/01/2021 CLINICAL DATA:  Metastatic disease evaluation EXAM: MRI HEAD WITHOUT AND WITH CONTRAST TECHNIQUE: Multiplanar, multiecho pulse sequences of the brain and surrounding structures were obtained without and with intravenous contrast. CONTRAST:  59mL GADAVIST GADOBUTROL 1 MMOL/ML IV SOLN COMPARISON:  None. FINDINGS: Brain: No acute infarct, mass effect or extra-axial collection. No acute or chronic hemorrhage. There is multifocal hyperintense T2-weighted signal within the white matter. Generalized volume loss without a clear lobar predilection. the midline structures are normal. Vascular: Abnormal right ICA flow void, possibly occlusion or slow flow. Skull and upper cervical spine: Normal calvarium and skull base. Visualized upper cervical spine and soft tissues are normal. Sinuses/Orbits:No paranasal sinus fluid levels or advanced mucosal thickening. No mastoid or middle ear effusion. Normal orbits. IMPRESSION: 1. No intracranial metastatic disease. 2. Abnormal right ICA flow void, possibly occlusion or slow flow. Electronically Signed   By: Ulyses Jarred M.D.   On: 09/01/2021 23:24   CT ABDOMEN PELVIS W CONTRAST  Result Date: 09/01/2021 CLINICAL DATA:  Metastatic disease evaluation. Right upper lobe lung mass. EXAM: CT ABDOMEN AND PELVIS WITH CONTRAST TECHNIQUE: Multidetector CT imaging of the abdomen and pelvis was  performed using the standard protocol following bolus administration of intravenous contrast. CONTRAST:  25mL OMNIPAQUE IOHEXOL 300 MG/ML  SOLN COMPARISON:  Chest CT dated 08/31/2021. FINDINGS: Lower chest: Partially visualized right pleural effusion with small pockets of pleural air. Background of emphysema. Diffuse interstitial coarsening of the right lung. Thickened and nodular appearance of the right pleural surface. Please refer to the report for the chest CT of 08/31/2021 for lung findings. No intra-abdominal free air or free fluid. Hepatobiliary: The liver is unremarkable. No intrahepatic biliary dilatation. The gallbladder is contracted no calcified gallstone. Pancreas: Unremarkable. No pancreatic ductal dilatation or surrounding inflammatory changes. Spleen: Normal in size without focal abnormality. Adrenals/Urinary Tract: The adrenal glands are unremarkable. The kidneys, visualized ureters, and urinary bladder appear unremarkable. Stomach/Bowel: There is no bowel obstruction or active inflammation. The appendix is normal. There is sigmoid diverticulosis without active inflammatory changes. Vascular/Lymphatic: Advanced aortoiliac  atherosclerotic disease. The IVC is unremarkable. No portal venous gas. There is no adenopathy. Reproductive: The prostate and seminal vesicles are grossly unremarkable. No pelvic mass. Other: None Musculoskeletal: Degenerative changes of the lower lumbar spine. No acute osseous pathology. IMPRESSION: 1. No acute intra-abdominal or pelvic pathology. No evidence of metastatic disease in the abdomen or pelvis. 2. Sigmoid diverticulosis. 3. Partially visualized right pleural effusion with small pockets of pleural air. 4. Thickened and nodular appearance of the right pleural surface. 5. Aortic Atherosclerosis (ICD10-I70.0) and Emphysema (ICD10-J43.9). Electronically Signed   By: Anner Crete M.D.   On: 09/01/2021 21:01   DG Chest Port 1 View  Result Date: 08/31/2021 CLINICAL  DATA:  Post right thoracentesis EXAM: PORTABLE CHEST 1 VIEW COMPARISON:  08/30/2021 FINDINGS: Decreased right pleural effusion which is now small. Airspace disease is present throughout the right lung. No pneumothorax Hyperinflation left lung. Apical linear density is unchanged likely scarring. Left lung base clear. No left effusion IMPRESSION: No complication post right thoracentesis. Electronically Signed   By: Franchot Gallo M.D.   On: 08/31/2021 12:32   US THORACENTESIS ASP PLEURAL SPACE W/IMG GUIDE  Result Date: 08/31/2021 CLINICAL DATA:  Right pleural effusion. EXAM: ULTRASOUND GUIDED RIGHT THORACENTESIS COMPARISON:  None. PROCEDURE: An ultrasound guided thoracentesis was thoroughly discussed with the patient and questions answered. The benefits, risks, alternatives and complications were also discussed. The patient understands and wishes to proceed with the procedure. Written consent was obtained. Ultrasound was performed to localize and mark an adequate pocket of fluid in the right chest. The area was then prepped and draped in the normal sterile fashion. 1% Lidocaine was used for local anesthesia. Under ultrasound guidance a 6 French Safe-T-Centesis catheter was introduced. Thoracentesis was performed. The catheter was removed and a dressing applied. COMPLICATIONS: None FINDINGS: A total of approximately 1.5 L of bloody fluid was removed. A fluid sample was sent for laboratory analysis. IMPRESSION: Successful ultrasound guided right thoracentesis yielding 1.5 L of pleural fluid. Electronically Signed   By: Aletta Edouard M.D.   On: 08/31/2021 13:18    Labs:  CBC: Recent Labs    08/30/21 1035 08/31/21 0029 09/01/21 0652  WBC 18.2* 17.8* 17.0*  HGB 13.3 12.6* 13.3  HCT 41.1 39.7 43.4  PLT 713* 577* 582*    COAGS: No results for input(s): INR, APTT in the last 8760 hours.  BMP: Recent Labs    08/30/21 1035 08/31/21 0029  NA 141 141  K 4.6 4.1  CL 100 102  CO2 30 29  GLUCOSE  123* 89  BUN 35* 29*  CALCIUM 9.5 9.2  CREATININE 0.89 0.89  GFRNONAA >60 >60    LIVER FUNCTION TESTS: Recent Labs    08/31/21 0029  ALBUMIN 2.2*    TUMOR MARKERS: No results for input(s): AFPTM, CEA, CA199, CHROMGRNA in the last 8760 hours.  Assessment and Plan:  Right upper lobe lung cancer: Princess Bruins. Lichtenwalner, 80 year old male, presents today to the [facility] Interventional Radiology department for an image-guided port-a-catheter placement.   Risks and benefits of image-guided port-a-catheter placement were discussed with the patient including, but not limited to bleeding, infection, pneumothorax, or fibrin sheath development and need for additional procedures.  All of the patient's questions were answered, patient is agreeable to proceed. He has been NPO.   Consent signed and in chart. At the patient's request his sister signed the consent.    Thank you for this interesting consult.  I greatly enjoyed meeting Alfredia Client  and look forward to participating in their care.  A copy of this report was sent to the requesting provider on this date.  Electronically Signed: Soyla Dryer, AGACNP-BC [phone] 09/26/2021, 8:10 AM   I spent a total of  30 Minutes   in face to face in clinical consultation, greater than 50% of which was counseling/coordinating care for port-a-catheter placement.

## 2021-09-26 NOTE — Progress Notes (Signed)
Patient clinically stable post port placement per Dr Denna Haggard, tolerated well , vitals stable pre and post procedure. Received Versed 0.5 Mg along with Fentanyl 50 mcg IV for procedure. Report given to [facility] RN post procedure/specials.

## 2021-09-26 NOTE — Procedures (Signed)
Interventional Radiology Procedure Note  Date of Procedure: 09/26/2021  Procedure: Chest port insertion   Findings:  1. Chest port insertion via left IJ   Complications: No immediate complications noted.   Estimated Blood Loss: minimal  Follow-up and Recommendations: 1. Ready for use    Albin Felling, MD  Vascular & Interventional Radiology  09/26/2021 11:44 AM

## 2021-09-29 NOTE — Progress Notes (Signed)
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  INTERVAL HISTORY: Patient returns to clinic today for further evaluation, discussion of his PET scan results, and treatment planning.  He continues to have significant weakness and fatigue and a poor appetite.  He has shortness of breath requiring oxygen 24 hours/day. He has no neurologic complaints.  He denies any fevers.  He has no chest pain, cough, or hemoptysis.  He denies any nausea, vomiting, constipation, or diarrhea.  He has no urinary complaints.  Patient offers no further specific complaints today. Number REVIEW OF SYSTEMS:   Review of Systems  Constitutional:  Positive for malaise/fatigue. Negative for fever and weight loss.  Respiratory:  Positive for shortness of breath. Negative for cough and hemoptysis.   Cardiovascular: Negative.  Negative for chest pain and leg swelling.  Gastrointestinal: Negative.  Negative for abdominal pain.  Genitourinary: Negative.  Negative for dysuria.  Musculoskeletal: Negative.  Negative for back pain.  Skin: Negative.  Negative for rash.  Neurological:  Positive for weakness. Negative for dizziness, focal weakness and headaches.  Psychiatric/Behavioral: Negative.  The patient is not nervous/anxious.    As per HPI. Otherwise, a complete review of systems is negative.  PAST MEDICAL HISTORY: Past Medical History:  Diagnosis Date   Glaucoma    Hyperlipidemia    Hypertension     PAST SURGICAL HISTORY: Past Surgical History:  Procedure Laterality Date   EYE SURGERY     IR IMAGING GUIDED PORT INSERTION  09/26/2021    FAMILY HISTORY: Family History  Problem Relation  Age of Onset   Varicose Veins Neg Hx    Vision loss Neg Hx     ADVANCED DIRECTIVES (Y/N):  N  HEALTH MAINTENANCE: Social History   Tobacco Use   Smoking status: Former    Types: Cigarettes    Quit date: 04/15/2018    Years since quitting: 3.4   Smokeless tobacco: Never  Substance Use Topics   Alcohol use: Yes   Drug use: No     Colonoscopy:  PAP:  Bone density:  Lipid panel:  No Known Allergies  Current Outpatient Medications  Medication Sig Dispense Refill   albuterol (VENTOLIN HFA) 108 (90 Base) MCG/ACT inhaler Inhale 2 puffs into the lungs every 6 (six) hours as needed for wheezing or shortness of breath. 8 g 2   amLODipine (NORVASC) 5 MG tablet take 1 tablet by mouth once daily     aspirin EC 81 MG tablet Take 81 mg by mouth daily.     atorvastatin (LIPITOR) 10 MG tablet take 1 tablet by mouth once daily     dexamethasone (DECADRON) 4 MG tablet Take 1 tablet (4 mg total) by mouth daily. 30 tablet 3   dorzolamide-timolol (COSOPT) 22.3-6.8 MG/ML ophthalmic solution 1 drop 2 (two) times daily.     mometasone-formoterol (DULERA) 200-5 MCG/ACT AERO Inhale 2 puffs into the lungs 2 (two) times daily. 13 g 1   nicotine (NICODERM CQ - DOSED IN MG/24 HOURS) 21 mg/24hr patch Place 1 patch (21 mg total) onto the skin daily as needed (nicotine craving). 28 patch 0   tiotropium (SPIRIVA) 18 MCG inhalation capsule Place 1 capsule (18 mcg total) into inhaler and inhale daily.  30 capsule 1   No current facility-administered medications for this visit.    OBJECTIVE: Vitals:   10/04/21 0932  BP: (!) 129/56  Pulse: (!) 109  Resp: 20  SpO2: 98%     Body mass index is 16.42 kg/m.    ECOG FS:1 - Symptomatic but completely ambulatory  General: Thin, no acute distress.  Sitting in wheelchair. Eyes: Pink conjunctiva, anicteric sclera. HEENT: Normocephalic, moist mucous membranes. Lungs: No audible wheezing or coughing. Heart: Regular rate and rhythm. Abdomen: Soft, nontender, no  obvious distention. Musculoskeletal: No edema, cyanosis, or clubbing. Neuro: Alert, answering all questions appropriately. Cranial nerves grossly intact. Skin: No rashes or petechiae noted. Psych: Normal affect.  LAB RESULTS:  Lab Results  Component Value Date   NA 141 08/31/2021   K 4.1 08/31/2021   CL 102 08/31/2021   CO2 29 08/31/2021   GLUCOSE 89 08/31/2021   BUN 29 (H) 08/31/2021   CREATININE 0.89 08/31/2021   CALCIUM 9.2 08/31/2021   ALBUMIN 2.2 (L) 08/31/2021   GFRNONAA >60 08/31/2021    Lab Results  Component Value Date   WBC 17.0 (H) 09/01/2021   NEUTROABS 14.5 (H) 09/01/2021   HGB 13.3 09/01/2021   HCT 43.4 09/01/2021   MCV 92.5 09/01/2021   PLT 582 (H) 09/01/2021     STUDIES: NM PET Image Initial (PI) Skull Base To Thigh  Result Date: 10/03/2021 CLINICAL DATA:  Initial treatment strategy for right lung adenocarcinoma. EXAM: NUCLEAR MEDICINE PET SKULL BASE TO THIGH TECHNIQUE: 5.94 mCi F-18 FDG was injected intravenously. Full-ring PET imaging was performed from the skull base to thigh after the radiotracer. CT data was obtained and used for attenuation correction and anatomic localization. Fasting blood glucose: 94 mg/dl COMPARISON:  CTs of the chest, abdomen and pelvis 08/31/2021 and 09/01/2021. FINDINGS: Mediastinal blood pool activity: SUV max 1.5 NECK: There are hypermetabolic right supraclavicular lymph nodes, measuring up to 1.2 cm on image 70/3 (SUV max 10.7. No hypermetabolic left cervical lymph nodes.There are no lesions of the pharyngeal mucosal space. Incidental CT findings: Extensive bilateral carotid atherosclerosis. CHEST: There are multiple enlarged and hypermetabolic mediastinal and hilar lymph nodes bilaterally. A large subcarinal nodal mass extends into the AP window and demonstrates an SUV max of 27.1. There is a small hypermetabolic right axillary lymph nodes. The irregular nodular pleural, fissural and septal thickening throughout the right  hemithorax on recent CT demonstrates extensive hypermetabolic activity. For example, medially at the right apex, there is an SUV max of 18.9. There is also some hypermetabolic activity within the left pleural space, most notably posterior to the midthoracic aorta (SUV max 6.2). There is a hypermetabolic perifissural nodule along the left major fissure (SUV max 2.2) left apical density measuring approximately 3.3 x 1.6 cm on image 77/3 is also mildly hypermetabolic (4.0). Based on morphology, this is nonspecific and potentially postinflammatory. Incidental CT findings: Underlying severe centrilobular and paraseptal emphysema. Small left greater than right pleural effusions. Diffuse atherosclerosis of the aorta, great vessels and coronary arteries. Left IJ Port-A-Cath extends to the superior cavoatrial junction. ABDOMEN/PELVIS: No definite hypermetabolic activity within the liver, adrenal glands, spleen or pancreas. However, there are several hypermetabolic lymph nodes within the gastrohepatic ligament and upper retroperitoneum. For example, and the right retrocrural space, there is hypermetabolic activity with an SUV max of 8.8. No hypermetabolic nodes are identified in the pelvis. No bowel lesions identified. Incidental CT findings: Aortic and branch vessel atherosclerosis. Scattered diverticular changes throughout the colon. No ascites or peritoneal  nodularity. SKELETON: Scattered small hypermetabolic osseous metastases are noted throughout the ribs, spine and bony pelvis. For example, there is a lesion in the right aspect of the L3 vertebral body which has an SUV max of 11.9. No evidence of pathologic fracture. Incidental CT findings: Thoracolumbar spondylosis, most advanced at L4-5. IMPRESSION: 1. 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 2. In addition, there is a  small contralateral left pleural effusion with associated mild hypermetabolic activity, suspicious for contralateral metastatic disease. 3. Multiple nodal metastases within the mediastinum, bilateral hilar regions, right supraclavicular, right axillary and upper abdominal stations. 4. No solid visceral organ metastases identified in the abdomen or pelvis. 5. Scattered small osseous metastases. Electronically Signed   By: Richardean Sale M.D.   On: 10/03/2021 16:58   IR IMAGING GUIDED PORT INSERTION  Result Date: 09/26/2021 INDICATION: port placement lung cancer EXAM: Ultrasound-guided puncture of the left internal jugular vein Placement of a left-sided chest port using fluoroscopic guidance MEDICATIONS: None ANESTHESIA/SEDATION: Moderate (conscious) sedation was employed during this procedure. A total of Versed 0.5 mg and Fentanyl 50 mcg was administered intravenously. Moderate Sedation Time: 26 minutes. The patient's level of consciousness and vital signs were monitored continuously by radiology nursing throughout the procedure under my direct supervision. FLUOROSCOPY TIME:  Fluoroscopy Time: 1 minute with 3 exposures COMPLICATIONS: None immediate. PROCEDURE: Informed written consent was obtained from the patient after a thorough discussion of the procedural risks, benefits and alternatives. All questions were addressed. Maximal Sterile Barrier Technique was utilized including caps, mask, sterile gowns, sterile gloves, sterile drape, hand hygiene and skin antiseptic. A timeout was performed prior to the initiation of the procedure. The patient was placed supine on the exam table. The left neck and chest was prepped and draped in the standard sterile fashion. A preliminary ultrasound of the left neck was performed and demonstrates a patent left internal jugular vein. A permanent ultrasound image was stored in the electronic medical record. The overlying skin was anesthetized with 1% Lidocaine. Using ultrasound  guidance, access was obtained into the left internal jugular vein using a 21 gauge micropuncture set. A wire was advanced into the SVC, a short incision was made at the puncture site, and serial dilatation performed. Next, in an ipsilateral infraclavicular location, an incision was made at the site of the subcutaneous reservoir. Blunt dissection was used to open a pocket to contain the reservoir. A subcutaneous tunnel was then created from the port site to the puncture site. A(n) 8 Fr single lumen catheter was advanced through the tunnel. The catheter was attached to the port and this was placed in the subcutaneous pocket. Under fluoroscopic guidance, a peel away sheath was placed, and the catheter was trimmed to the appropriate length and was advanced into the central veins. The catheter length is 32 cm. The tip of the catheter lies near the superior cavoatrial junction. The port flushes and aspirates appropriately. The port was flushed and locked with heparinized saline. The port pocket was closed in 2 layers using 3-0 and 4-0 Vicryl/absorbable suture. Dermabond was also applied to both incisions. The patient tolerated the procedure well and was transferred to recovery in stable condition. IMPRESSION: Successful placement of a left chest port using the left internal jugular vein. The port is ready for immediate use. Electronically Signed   By: Albin Felling M.D.   On: 09/26/2021 14:40    ASSESSMENT: Stage  IVA adenocarcinoma of the right upper lobe lung with malignant pleural effusion.  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  Patient also noted to have right hilar, mediastinal, and right neck lymphadenopathy.  PET scan results from October 02, 2021 reviewed independently and report as above confirming stage and  anatomic spread of disease.   Thoracentesis on August 31, 2021 confirmed adenocarcinoma, likely of lung origin.  Unfortunately there was not enough tissue or additional testing.  MRI of the brain on September 01, 2021 did not reveal any metastatic disease.  Despite patient's decreased performance status, he wishes to attempt systemic chemotherapy with carboplatinum, Taxol, and Avastin.  He has also agreed to ultrasound-guided biopsy of right supraclavicular lymph node in an effort to obtain more tissue for ancillary testing.  Patient has had port placement.  Return to clinic in 1 week for further evaluation and consideration of cycle 1.  Appreciate palliative care input. Shortness of breath: Continue oxygen as prescribed.   Poor appetite: Appreciate dietary input.  Patient was also given a prescription for dexamethasone 4 mg daily.  I spent a total of 30 minutes reviewing chart data, face-to-face evaluation with the patient, counseling and coordination of care as detailed above.    Patient expressed understanding and was in agreement with this plan. He also understands that He can call clinic at any time with any questions, concerns, or complaints.    Cancer Staging  Adenocarcinoma of right lung [facility]) Staging form: Lung, AJCC 8th Edition - Clinical: Stage IVA (cT2, cN2, cM1a) - Signed by Lloyd Huger, MD on 09/18/2021  Lloyd Huger, MD   10/04/2021 3:36 PM

## 2021-10-02 ENCOUNTER — Encounter
Admission: RE | Admit: 2021-10-02 | Discharge: 2021-10-02 | Disposition: A | Discharge status: Home / Self Care | Payer: Medicare HMO | Source: Ambulatory Visit | Attending: Oncology | Admitting: Oncology

## 2021-10-02 ENCOUNTER — Other Ambulatory Visit: Payer: Self-pay

## 2021-10-02 DIAGNOSIS — C3491 Malignant neoplasm of unspecified part of right bronchus or lung: Secondary | ICD-10-CM | POA: Diagnosis not present

## 2021-10-02 DIAGNOSIS — I2510 Atherosclerotic heart disease of native coronary artery without angina pectoris: Secondary | ICD-10-CM | POA: Diagnosis not present

## 2021-10-02 DIAGNOSIS — J432 Centrilobular emphysema: Secondary | ICD-10-CM | POA: Diagnosis not present

## 2021-10-02 DIAGNOSIS — J90 Pleural effusion, not elsewhere classified: Secondary | ICD-10-CM | POA: Diagnosis not present

## 2021-10-02 LAB — GLUCOSE, CAPILLARY: Glucose-Capillary: 94 mg/dL (ref 70–99)

## 2021-10-02 IMAGING — CT NM PET TUM IMG INITIAL (PI) SKULL BASE T - THIGH
10 series · 22 of 25 positions shown · non-contrast
Comparison: CTs of the chest, abdomen and pelvis 08/31/2021 and
09/01/2021.

CLINICAL DATA: Initial treatment strategy for right lung
adenocarcinoma.

EXAM:
NUCLEAR MEDICINE PET SKULL BASE TO THIGH
TECHNIQUE: 5.94 mCi F-18 FDG was injected intravenously. Full-ring PET imaging
was performed from the skull base to thigh after the radiotracer. CT
data was obtained and used for attenuation correction and anatomic
localization.
Fasting blood glucose: 94 mg/dl

[Series 3: ct wb 5.0 b30f · axial · 5.0mm · 0.98mm/px · z∈[-1157,-290]mm · 3 of 290 slices shown]
[im 1/290]
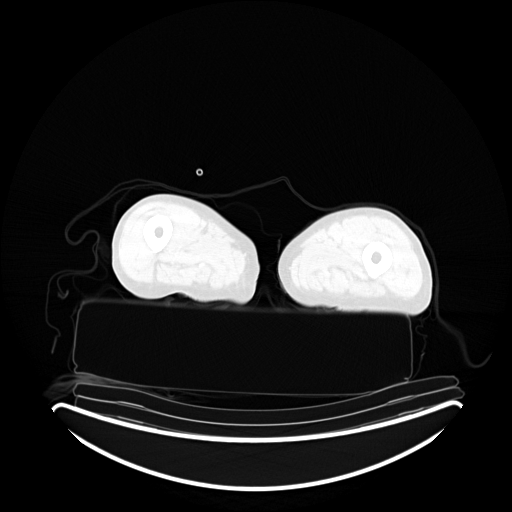
[im 145/290]
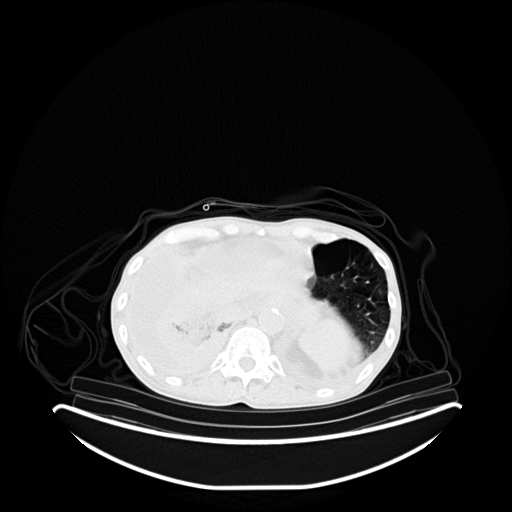
[im 290/290  brain]
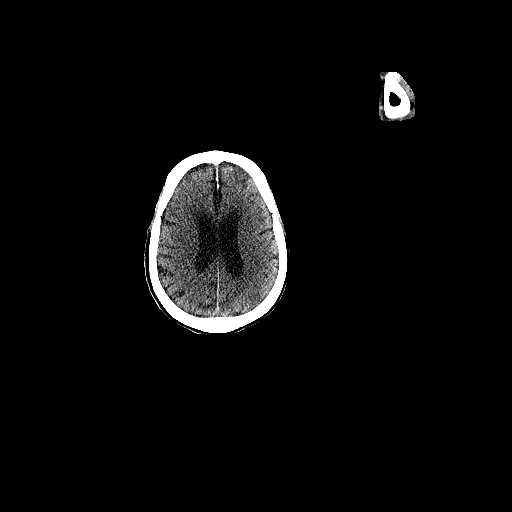

[Series 5: pet wb uncorrected (nac) · axial · 5.0mm · 4.07mm/px · z∈[-1157,-290]mm · 2 of 290 slices shown]
[im 1/290]
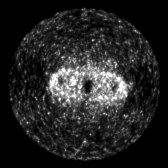
[im 290/290]
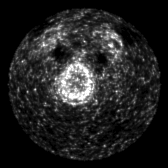

[Series 6: pet wb (ac) · axial · 5.0mm · 3.13mm/px · z∈[-1157,-290]mm · 3 of 290 slices shown]
[im 1/290]
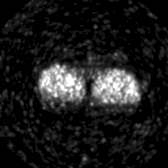
[im 145/290]
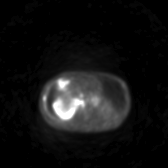
[im 290/290]
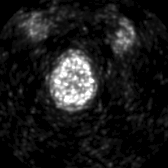

[Series 603: fused axial · 3 of 284 slices shown]
[im 1/284]
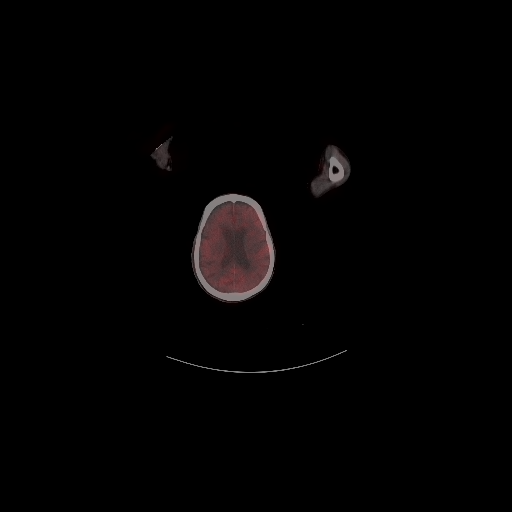
[im 95/284]
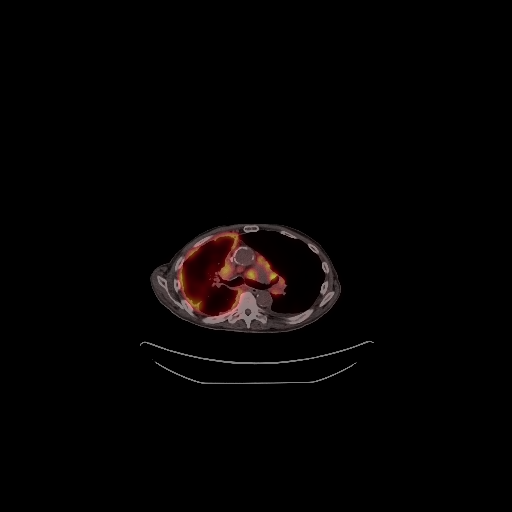
[im 189/284]
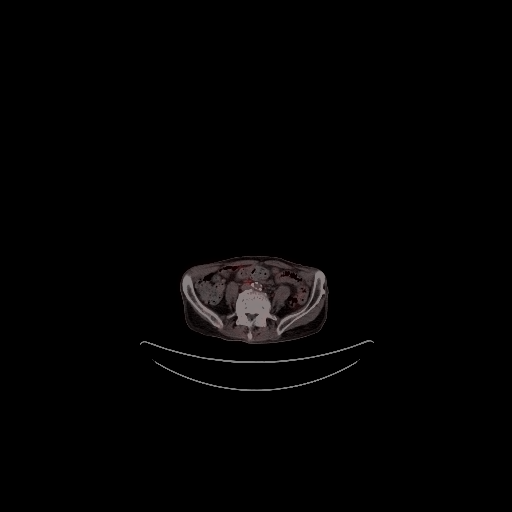

[Series 604: fused coronal · 1 of 83 slices shown]
[im 1/83]
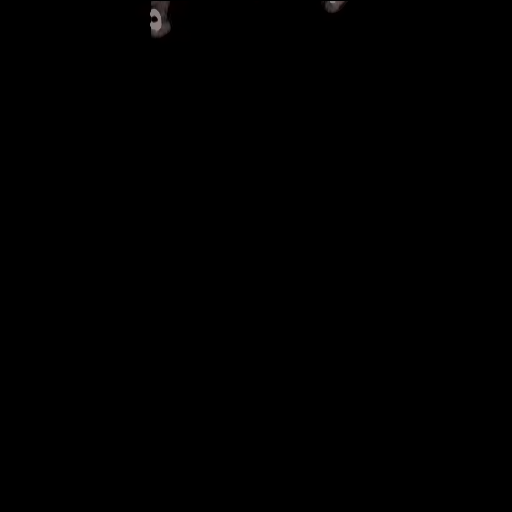

[Series 605: fused sagittal · 2 of 131 slices shown]
[im 1/131]
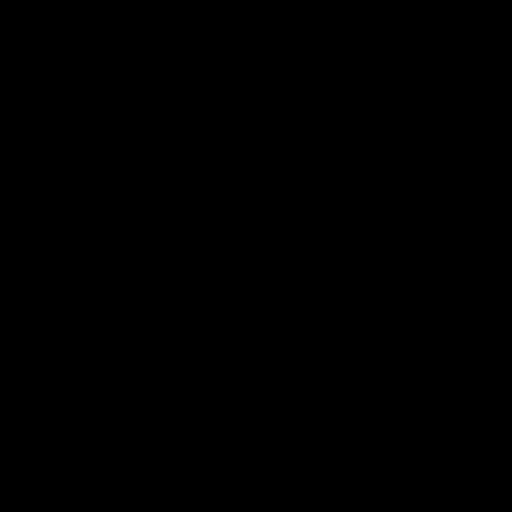
[im 131/131]
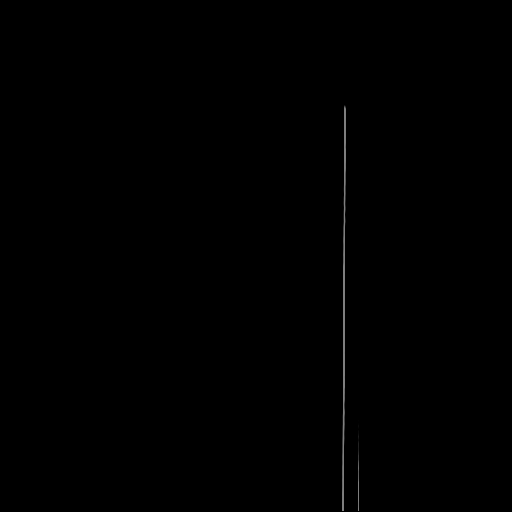

[Series 606: pet axial · 4 of 288 slices shown]
[im 1/288]
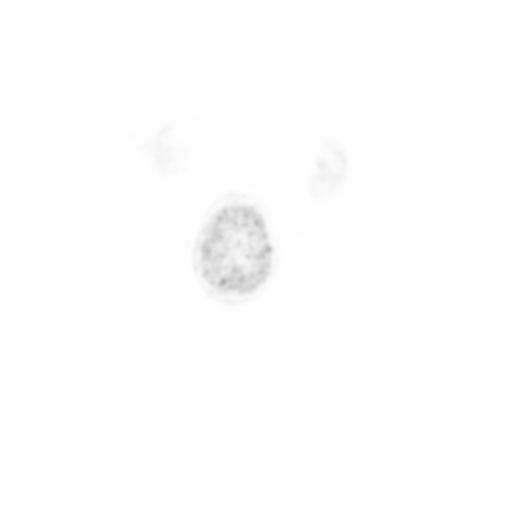
[im 96/288]
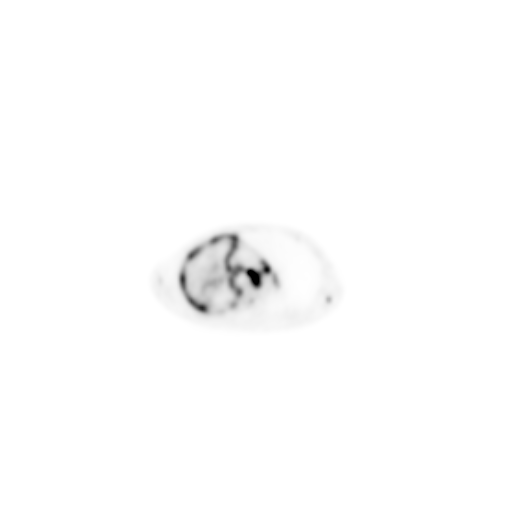
[im 192/288]
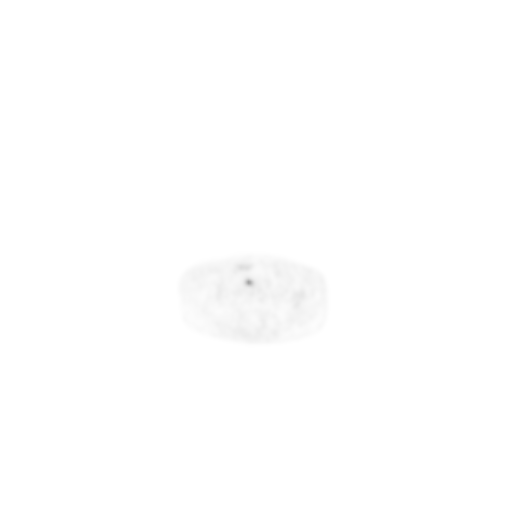
[im 288/288]
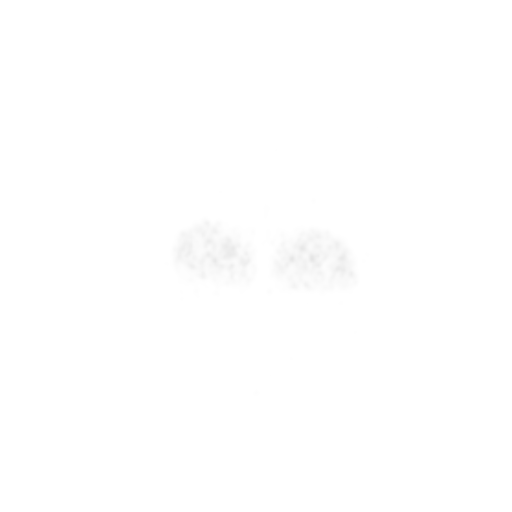

[Series 607: pet coronal · 1 of 116 slices shown]
[im 116/116]
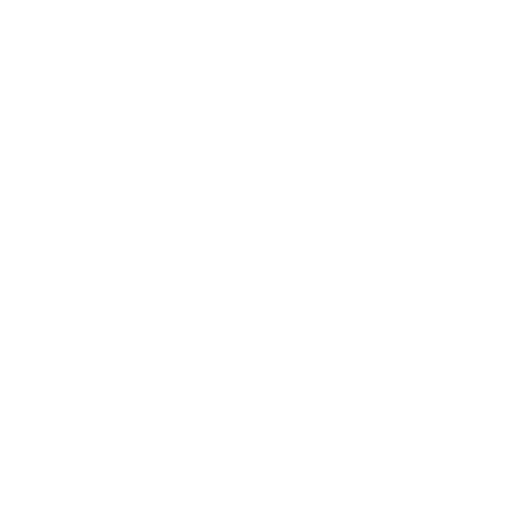

[Series 608: pet sagittal · 2 of 133 slices shown]
[im 1/133]
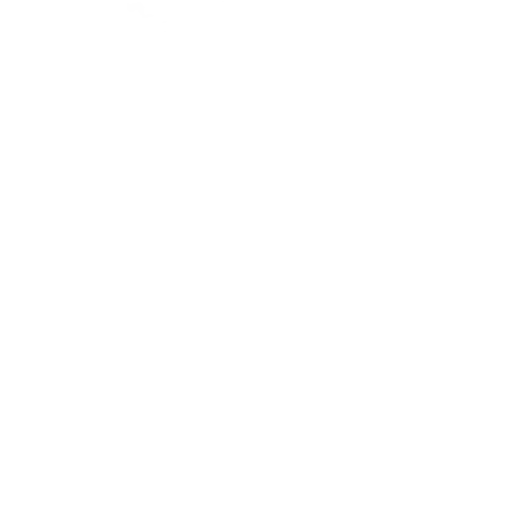
[im 133/133]
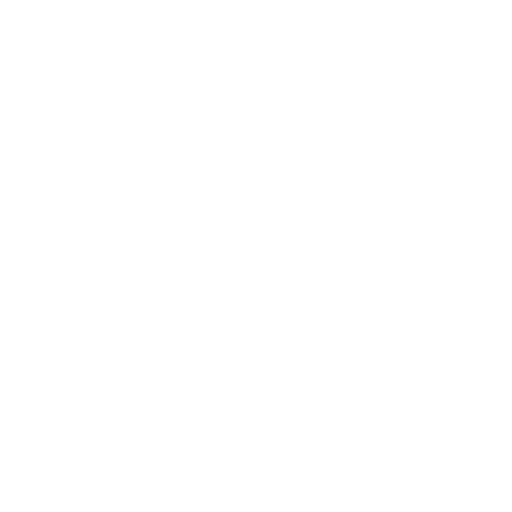

[Series 1105: results mm oncology reading · 1.0mm · 0.85mm/px · 1 of 12 slices shown]
[im 1/12]
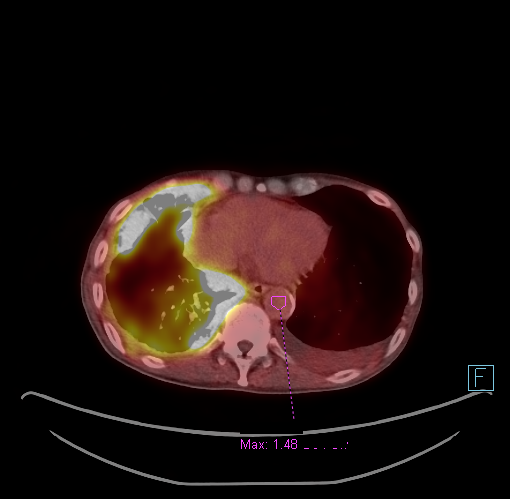

[22 of 25 positions shown; findings below may reference images not displayed]

FINDINGS: Mediastinal blood pool activity: SUV max

NECK:

There are hypermetabolic right supraclavicular lymph nodes,
measuring up to 1.2 cm on image 70/3 (SUV max 10.7. No
hypermetabolic left cervical lymph nodes.There are no lesions of the
pharyngeal mucosal space.

Incidental CT findings: Extensive bilateral carotid atherosclerosis.

CHEST:

There are multiple enlarged and hypermetabolic mediastinal and hilar
lymph nodes bilaterally. A large subcarinal nodal mass extends into
the AP window and demonstrates an SUV max of 27.1. There is a small
hypermetabolic right axillary lymph nodes. The irregular nodular
pleural, fissural and septal thickening throughout the right
hemithorax on recent CT demonstrates extensive hypermetabolic
activity. For example, medially at the right apex, there is an SUV
max of 18.9. There is also some hypermetabolic activity within the
left pleural space, most notably posterior to the midthoracic aorta
(SUV max 6.2). There is a hypermetabolic perifissural nodule along
the left major fissure (SUV max 2.2) left apical density measuring
approximately 3.3 x 1.6 cm on image 77/3 is also mildly
hypermetabolic (4.0). Based on morphology, this is nonspecific and
potentially postinflammatory.

Incidental CT findings: Underlying severe centrilobular and
paraseptal emphysema. Small left greater than right pleural
effusions. Diffuse atherosclerosis of the aorta, great vessels and
coronary arteries. Left IJ Port-A-Cath extends to the superior
cavoatrial junction.

ABDOMEN/PELVIS:

No definite hypermetabolic activity within the liver, adrenal
glands, spleen or pancreas. However, there are several
hypermetabolic lymph nodes within the gastrohepatic ligament and
upper retroperitoneum. For example, and the right retrocrural space,
there is hypermetabolic activity with an SUV max of 8.8. No
hypermetabolic nodes are identified in the pelvis. No bowel lesions
identified.

Incidental CT findings: Aortic and branch vessel atherosclerosis.
Scattered diverticular changes throughout the colon. No ascites or
peritoneal nodularity.

SKELETON:

Scattered small hypermetabolic osseous metastases are noted
throughout the ribs, spine and bony pelvis. For example, there is a
lesion in the right aspect of the L3 vertebral body which has an SUV
max of 11.9. No evidence of pathologic fracture.

Incidental CT findings: Thoracolumbar spondylosis, most advanced at
L4-5.
IMPRESSION: 1. Extensive hypermetabolic activity throughout the right pleural
space and right lung, compatible with locally advanced metastatic
pleural disease and probable lymphangitic spread of tumor.
2. In addition, there is a small contralateral left pleural effusion
with associated mild hypermetabolic activity, suspicious for
contralateral metastatic disease.
3. Multiple nodal metastases within the mediastinum, bilateral hilar
regions, right supraclavicular, right axillary and upper abdominal
stations.
4. No solid visceral organ metastases identified in the abdomen or
pelvis.
5. Scattered small osseous metastases.

## 2021-10-02 MED ORDER — FLUDEOXYGLUCOSE F - 18 (FDG) INJECTION
5.9400 | Freq: Once | INTRAVENOUS | Status: AC | PRN
  Administered 2021-10-02: 5.94 via INTRAVENOUS

## 2021-10-04 ENCOUNTER — Telehealth: Payer: Self-pay | Admitting: Emergency Medicine

## 2021-10-04 ENCOUNTER — Other Ambulatory Visit: Payer: Self-pay

## 2021-10-04 ENCOUNTER — Inpatient Hospital Stay: Payer: Medicare HMO

## 2021-10-04 ENCOUNTER — Inpatient Hospital Stay: Payer: Medicare HMO | Attending: Oncology | Admitting: Hospice and Palliative Medicine

## 2021-10-04 ENCOUNTER — Inpatient Hospital Stay (HOSPITAL_BASED_OUTPATIENT_CLINIC_OR_DEPARTMENT_OTHER): Payer: Medicare HMO | Admitting: Oncology

## 2021-10-04 ENCOUNTER — Inpatient Hospital Stay (HOSPITAL_BASED_OUTPATIENT_CLINIC_OR_DEPARTMENT_OTHER): Payer: Medicare HMO | Admitting: Hospice and Palliative Medicine

## 2021-10-04 ENCOUNTER — Encounter: Payer: Self-pay | Admitting: *Deleted

## 2021-10-04 VITALS — BP 129/56 | HR 109 | Resp 20 | Wt 108.0 lb

## 2021-10-04 DIAGNOSIS — Z515 Encounter for palliative care: Secondary | ICD-10-CM

## 2021-10-04 DIAGNOSIS — J9859 Other diseases of mediastinum, not elsewhere classified: Secondary | ICD-10-CM | POA: Diagnosis not present

## 2021-10-04 DIAGNOSIS — Z7189 Other specified counseling: Secondary | ICD-10-CM | POA: Diagnosis not present

## 2021-10-04 DIAGNOSIS — Z5112 Encounter for antineoplastic immunotherapy: Secondary | ICD-10-CM | POA: Insufficient documentation

## 2021-10-04 DIAGNOSIS — F17210 Nicotine dependence, cigarettes, uncomplicated: Secondary | ICD-10-CM | POA: Insufficient documentation

## 2021-10-04 DIAGNOSIS — Z79899 Other long term (current) drug therapy: Secondary | ICD-10-CM | POA: Insufficient documentation

## 2021-10-04 DIAGNOSIS — I10 Essential (primary) hypertension: Secondary | ICD-10-CM | POA: Insufficient documentation

## 2021-10-04 DIAGNOSIS — R970 Elevated carcinoembryonic antigen [CEA]: Secondary | ICD-10-CM | POA: Insufficient documentation

## 2021-10-04 DIAGNOSIS — D701 Agranulocytosis secondary to cancer chemotherapy: Secondary | ICD-10-CM | POA: Insufficient documentation

## 2021-10-04 DIAGNOSIS — Z9981 Dependence on supplemental oxygen: Secondary | ICD-10-CM | POA: Diagnosis not present

## 2021-10-04 DIAGNOSIS — Z5111 Encounter for antineoplastic chemotherapy: Secondary | ICD-10-CM | POA: Diagnosis not present

## 2021-10-04 DIAGNOSIS — R978 Other abnormal tumor markers: Secondary | ICD-10-CM | POA: Insufficient documentation

## 2021-10-04 DIAGNOSIS — C3491 Malignant neoplasm of unspecified part of right bronchus or lung: Secondary | ICD-10-CM

## 2021-10-04 DIAGNOSIS — D6481 Anemia due to antineoplastic chemotherapy: Secondary | ICD-10-CM | POA: Insufficient documentation

## 2021-10-04 DIAGNOSIS — J910 Malignant pleural effusion: Secondary | ICD-10-CM | POA: Diagnosis not present

## 2021-10-04 MED ORDER — PROCHLORPERAZINE MALEATE 10 MG PO TABS: 10.0000 mg | ORAL_TABLET | Freq: Four times a day (QID) | ORAL | 1 refills | Status: AC | PRN

## 2021-10-04 MED ORDER — LIDOCAINE-PRILOCAINE 2.5-2.5 % EX CREA: TOPICAL_CREAM | CUTANEOUS | 3 refills | Status: AC

## 2021-10-04 MED ORDER — DEXAMETHASONE 4 MG PO TABS
4.0000 mg | ORAL_TABLET | Freq: Every day | ORAL | 3 refills | Status: AC
Start: 2021-10-04 — End: ?

## 2021-10-04 MED ORDER — ONDANSETRON HCL 8 MG PO TABS: 8.0000 mg | ORAL_TABLET | Freq: Two times a day (BID) | ORAL | 1 refills | Status: AC | PRN

## 2021-10-04 NOTE — Progress Notes (Signed)
Nutrition Assessment   Reason for Assessment:   Referral from [place], NP   ASSESSMENT: 80 year old male with stage IV, right lung mass, malignant pleural effusion.  Past medical history of HTN, HLD, glaucoma, right eye blindness, tobacco use.  Planning chemotherapy.   Met with patient and sister Legrand Rams in clinic.  Patient reports that he has no appetite especially since being discharged from the hospital.  Patient lives with brother.  Patient does not like ensure or boost shakes but is drinking some of the boost.  Reports that boost shakes makes him have a bowel movement.  Yesterday was able to eat salmon cake biscuit, small amount of vegetable soup and banana.  Reports a bad taste in his mouth.  Rinses mouth after spiriva.  Denies nausea, constipation or diarrhea. Last bowel movement was yesterday.      Nutrition Focused Physical Exam: Limited as patient wearing jeans, socks, shoes, 2 shirts.   Orbital- severe Buccal- severe Upper arm - unable to exam Ribs- severe Temple - severe Clavicle - unable to exam Shoulder - unable to exam Scapula - unable to exam Hand - moderate Thigh- unable to exam Calf- severe  Medications: reviewed   Labs: none taken today   Anthropometrics:   Height: 68 inches Weight: 108 lb today 125 lb 08/04/21 noted per chart BMI: 16  14% weight loss in the last month, significant   Estimated Energy Needs  Kcals: 1470-1700 Protein: 74-85 g Fluid: 1.4 L   NUTRITION DIAGNOSIS: Inadequate oral intake related to cancer, shortness of breath, no appetite as evidenced by 14% weight loss in the last month and eating < 50% estimate energy needs for > 5 days.   MALNUTRITION DIAGNOSIS: Severe malnutrition in context of acute illness likely progressing to chronic    INTERVENTION:  Recommend trial of appetite stimulant and spoke with MD. Starting dexamethasone Provided samples of Reason shake, packet of Carnation breakfast essentials, orgain, ensure  clear Discussed ways to add calories and protein to diet. Handout provided to sister Contact information provided   MONITORING, EVALUATION, GOAL: weight trends, intake   Next Visit: to be determined with treatment  Marek Nghiem B. Zenia Resides, [place], [place] Registered Dietitian [phone] (mobile)

## 2021-10-04 NOTE — Progress Notes (Signed)
[facility]  Telephone:([phone] [phone]   Name: Cody Williams Date: 10/04/2021 MRN: [id number]  DOB: 11/18/1940  Patient Care Team: Baxter Hire, MD as PCP - General (Internal Medicine) Telford Nab, RN as Oncology Nurse Navigator    REASON FOR CONSULTATION: Cody Williams is a 80 y.o. male with multiple medical problems including hypertension, hyperlipidemia, glaucoma with right eye blindness, and chronic tobacco use, who was hospitalized 08/30/2021 to 09/02/2021 with shortness of breath.  CT of the chest revealed a right upper lobe spiculated mass with extension into the mediastinum and right hilar/mediastinal/right neck lymphadenopathy concerning for metastatic disease.  Palliative care was consulted up address goals.   SOCIAL HISTORY:     reports that he quit smoking about 3 years ago. His smoking use included cigarettes. He has never used smokeless tobacco. He reports current alcohol use. He reports that he does not use drugs.  Patient is divorced.  He lives at home with a brother.  Patient sister is his primary caregiver.He has 5 children but says that they are not very involved in his life.  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    ADVANCE DIRECTIVES:  Not on file  CODE STATUS: DNR/DNI (MOST form completed on 10/04/21)  PAST MEDICAL HISTORY: Past Medical History:  Diagnosis Date   Glaucoma    Hyperlipidemia    Hypertension     PAST SURGICAL HISTORY:  Past Surgical History:  Procedure Laterality Date   EYE SURGERY     IR IMAGING GUIDED PORT INSERTION  09/26/2021    HEMATOLOGY/ONCOLOGY HISTORY:  Oncology History  Adenocarcinoma of right lung ([place])  09/18/2021 Initial Diagnosis   Adenocarcinoma of right lung ([place])   09/18/2021 Cancer Staging   Staging form: Lung, AJCC 8th Edition - Clinical: Stage IVA (cT2, cN2, cM1a) - Signed by Cody Huger, MD on 09/18/2021      ALLERGIES:  has No Known Allergies.  MEDICATIONS:  Current Outpatient Medications  Medication Sig Dispense Refill   albuterol (VENTOLIN HFA) 108 (90 Base) MCG/ACT inhaler Inhale 2 puffs into the lungs every 6 (six) hours as needed for wheezing or shortness of breath. 8 g 2   amLODipine (NORVASC) 5 MG tablet take 1 tablet by mouth once daily     aspirin EC 81 MG tablet Take 81 mg by mouth daily.     atorvastatin (LIPITOR) 10 MG tablet take 1 tablet by mouth once daily     dexamethasone (DECADRON) 4 MG tablet Take 1 tablet (4 mg total) by mouth daily. 30 tablet 3   dorzolamide-timolol (COSOPT) 22.3-6.8 MG/ML ophthalmic solution 1 drop 2 (two) times daily.     mometasone-formoterol (DULERA) 200-5 MCG/ACT AERO Inhale 2 puffs into the lungs 2 (two) times daily. 13 g 1   nicotine (NICODERM CQ - DOSED IN MG/24 HOURS) 21 mg/24hr patch Place 1 patch (21 mg total) onto the skin daily as needed (nicotine craving). 28 patch 0   tiotropium (SPIRIVA) 18 MCG inhalation capsule Place 1 capsule (18 mcg total) into inhaler and inhale daily. 30 capsule 1   No current facility-administered medications for this visit.    VITAL SIGNS: There were no vitals taken for this visit. There were no vitals filed for this visit.  Estimated body mass index is 16.42 kg/m as calculated from the following:   Height as of 09/26/21: 5\' 8"  (1.727 m).   Weight as of an earlier encounter on 10/04/21: 108 lb (  49 kg).  LABS: CBC:    Component Value Date/Time   WBC 17.0 (H) 09/01/2021 0652   HGB 13.3 09/01/2021 0652   HCT 43.4 09/01/2021 0652   PLT 582 (H) 09/01/2021 0652   MCV 92.5 09/01/2021 0652   NEUTROABS 14.5 (H) 09/01/2021 0652   LYMPHSABS 0.8 09/01/2021 0652   MONOABS 1.5 (H) 09/01/2021 0652   EOSABS 0.1 09/01/2021 0652   BASOSABS 0.0 09/01/2021 0652   Comprehensive Metabolic Panel:    Component Value Date/Time   NA 141 08/31/2021 0029   K 4.1 08/31/2021 0029   CL 102 08/31/2021 0029   CO2 29  08/31/2021 0029   BUN 29 (H) 08/31/2021 0029   CREATININE 0.89 08/31/2021 0029   GLUCOSE 89 08/31/2021 0029   CALCIUM 9.2 08/31/2021 0029   ALBUMIN 2.2 (L) 08/31/2021 0029    RADIOGRAPHIC STUDIES: NM PET Image Initial (PI) Skull Base To Thigh  Result Date: 10/03/2021 CLINICAL DATA:  Initial treatment strategy for right lung adenocarcinoma. EXAM: NUCLEAR MEDICINE PET SKULL BASE TO THIGH TECHNIQUE: 5.94 mCi F-18 FDG was injected intravenously. Full-ring PET imaging was performed from the skull base to thigh after the radiotracer. CT data was obtained and used for attenuation correction and anatomic localization. Fasting blood glucose: 94 mg/dl COMPARISON:  CTs of the chest, abdomen and pelvis 08/31/2021 and 09/01/2021. FINDINGS: Mediastinal blood pool activity: SUV max 1.5 NECK: There are hypermetabolic right supraclavicular lymph nodes, measuring up to 1.2 cm on image 70/3 (SUV max 10.7. No hypermetabolic left cervical lymph nodes.There are no lesions of the pharyngeal mucosal space. Incidental CT findings: Extensive bilateral carotid atherosclerosis. CHEST: There are multiple enlarged and hypermetabolic mediastinal and hilar lymph nodes bilaterally. A large subcarinal nodal mass extends into the AP window and demonstrates an SUV max of 27.1. There is a small hypermetabolic right axillary lymph nodes. The irregular nodular pleural, fissural and septal thickening throughout the right hemithorax on recent CT demonstrates extensive hypermetabolic activity. For example, medially at the right apex, there is an SUV max of 18.9. There is also some hypermetabolic activity within the left pleural space, most notably posterior to the midthoracic aorta (SUV max 6.2). There is a hypermetabolic perifissural nodule along the left major fissure (SUV max 2.2) left apical density measuring approximately 3.3 x 1.6 cm on image 77/3 is also mildly hypermetabolic (4.0). Based on morphology, this is nonspecific and potentially  postinflammatory. Incidental CT findings: Underlying severe centrilobular and paraseptal emphysema. Small left greater than right pleural effusions. Diffuse atherosclerosis of the aorta, great vessels and coronary arteries. Left IJ Port-A-Cath extends to the superior cavoatrial junction. ABDOMEN/PELVIS: No definite hypermetabolic activity within the liver, adrenal glands, spleen or pancreas. However, there are several hypermetabolic lymph nodes within the gastrohepatic ligament and upper retroperitoneum. For example, and the right retrocrural space, there is hypermetabolic activity with an SUV max of 8.8. No hypermetabolic nodes are identified in the pelvis. No bowel lesions identified. Incidental CT findings: Aortic and branch vessel atherosclerosis. Scattered diverticular changes throughout the colon. No ascites or peritoneal nodularity. SKELETON: Scattered small hypermetabolic osseous metastases are noted throughout the ribs, spine and bony pelvis. For example, there is a lesion in the right aspect of the L3 vertebral body which has an SUV max of 11.9. No evidence of pathologic fracture. Incidental CT findings: Thoracolumbar spondylosis, most advanced at L4-5. IMPRESSION: 1. 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 2.  In addition, there is a small contralateral left pleural effusion with associated mild hypermetabolic activity, suspicious for contralateral metastatic disease. 3. Multiple nodal metastases within the mediastinum, bilateral hilar regions, right supraclavicular, right axillary and upper abdominal stations. 4. No solid visceral organ metastases identified in the abdomen or pelvis. 5. Scattered small osseous metastases. Electronically Signed   By: Richardean Sale M.D.   On: 10/03/2021 16:58   IR IMAGING GUIDED PORT INSERTION  Result Date:  09/26/2021 INDICATION: port placement lung cancer EXAM: Ultrasound-guided puncture of the left internal jugular vein Placement of a left-sided chest port using fluoroscopic guidance MEDICATIONS: None ANESTHESIA/SEDATION: Moderate (conscious) sedation was employed during this procedure. A total of Versed 0.5 mg and Fentanyl 50 mcg was administered intravenously. Moderate Sedation Time: 26 minutes. The patient's level of consciousness and vital signs were monitored continuously by radiology nursing throughout the procedure under my direct supervision. FLUOROSCOPY TIME:  Fluoroscopy Time: 1 minute with 3 exposures COMPLICATIONS: None immediate. PROCEDURE: Informed written consent was obtained from the patient after a thorough discussion of the procedural risks, benefits and alternatives. All questions were addressed. Maximal Sterile Barrier Technique was utilized including caps, mask, sterile gowns, sterile gloves, sterile drape, hand hygiene and skin antiseptic. A timeout was performed prior to the initiation of the procedure. The patient was placed supine on the exam table. The left neck and chest was prepped and draped in the standard sterile fashion. A preliminary ultrasound of the left neck was performed and demonstrates a patent left internal jugular vein. A permanent ultrasound image was stored in the electronic medical record. The overlying skin was anesthetized with 1% Lidocaine. Using ultrasound guidance, access was obtained into the left internal jugular vein using a 21 gauge micropuncture set. A wire was advanced into the SVC, a short incision was made at the puncture site, and serial dilatation performed. Next, in an ipsilateral infraclavicular location, an incision was made at the site of the subcutaneous reservoir. Blunt dissection was used to open a pocket to contain the reservoir. A subcutaneous tunnel was then created from the port site to the puncture site. A(n) 8 Fr single lumen catheter was  advanced through the tunnel. The catheter was attached to the port and this was placed in the subcutaneous pocket. Under fluoroscopic guidance, a peel away sheath was placed, and the catheter was trimmed to the appropriate length and was advanced into the central veins. The catheter length is 32 cm. The tip of the catheter lies near the superior cavoatrial junction. The port flushes and aspirates appropriately. The port was flushed and locked with heparinized saline. The port pocket was closed in 2 layers using 3-0 and 4-0 Vicryl/absorbable suture. Dermabond was also applied to both incisions. The patient tolerated the procedure well and was transferred to recovery in stable condition. IMPRESSION: Successful placement of a left chest port using the left internal jugular vein. The port is ready for immediate use. Electronically Signed   By: Albin Felling M.D.   On: 09/26/2021 14:40    PERFORMANCE STATUS (ECOG) : 2 - Symptomatic, <50% confined to bed  Review of Systems Unless otherwise noted, a complete review of systems is negative.  Physical Exam General: NAD Pulmonary: Unlabored, on O2 Extremities: no edema, no joint deformities Skin: no rashes Neurological: Weakness but otherwise nonfocal  IMPRESSION: Routine follow-up.  Patient denies significant changes.  He remains weak and functionally limited by his breathing.  He is not eating much.  He is receiving home health PT/OT.  Patient is pending home palliative care evaluation.  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  Hypermetabolic left pleural effusion suspicious for contralateral metastatic disease.  Patient had extensive nodal metastases.  Plan is for chemotherapy.  We discussed CODE STATUS today.  Patient stated clearly that he would not want to be resuscitated nor have his life prolonged  artificially machines.  His sister was in agreement with this decision.   I completed a MOST form today. The patient and family outlined their wishes for the following treatment decisions:  Cardiopulmonary Resuscitation: Do Not Attempt Resuscitation (DNR/No CPR)  Medical Interventions: Limited Additional Interventions: Use medical treatment, IV fluids and cardiac monitoring as indicated, DO NOT USE intubation or mechanical ventilation. May consider use of less invasive airway support such as BiPAP or CPAP. Also provide comfort measures. Transfer to the hospital if indicated. Avoid intensive care.   Antibiotics: Antibiotics if indicated  IV Fluids: IV fluids if indicated  Feeding Tube: Left Blank    PLAN: -Continue current scope of treatment -Continue home health -Home palliative care -DNR/DNI -MOST form completed -Follow-up telephone visit 1 month   Patient expressed understanding and was in agreement with this plan. He also understands that He can call the clinic at any time with any questions, concerns, or complaints.     Time Total: 15 minutes  Visit consisted of counseling and education dealing with the complex and emotionally intense issues of symptom management and palliative care in the setting of serious and potentially life-threatening illness.Greater than 50%  of this time was spent counseling and coordinating care related to the above assessment and plan.  Signed by: Altha Harm, PhD, NP-C

## 2021-10-04 NOTE — Telephone Encounter (Signed)
Called patient to inform of scheduled biopsy information. Pt sister answered and confirmed availability for appt as scheduled. Pt and family verbalized understanding of need [facility] 1230 on Tuesday 10/10/2022 and that IR nurse will be calling a few days prior to discuss procedure.

## 2021-10-04 NOTE — Progress Notes (Signed)
Pt c/o productive cough and poor appetite.

## 2021-10-04 NOTE — Progress Notes (Signed)
Multidisciplinary Oncology Council Documentation  Cody Williams was presented by our [facility] on 10/04/2021, which included representatives from:  Palliative Care Dietitian  Physical/Occupational Therapist Nurse Navigator Genetics Speech Therapist Social work Survivorship RN Financial Navigator Research RN   Shi currently presents with history of stage IV lung cancer  We reviewed previous medical and familial history, history of present illness, and recent lab results along with all available histopathologic and imaging studies. The [facility] considered available treatment options and made the following recommendations/referrals:  Future consideration of rehab, already followed by palliative care and nutrition  The MOC is a meeting of clinicians from various specialty areas who evaluate and discuss patients for whom a multidisciplinary approach is being considered. Final determinations in the plan of care are those of the provider(s).   Today's extended care, comprehensive team conference, Cody Williams was not present for the discussion and was not examined.

## 2021-10-04 NOTE — Progress Notes (Signed)
START OFF PATHWAY REGIMEN - Non-Small Cell Lung   [id number]:Carboplatin + Paclitaxel (200) + Bevacizumab q21 Days:   A cycle is every 21 days:     Paclitaxel      Carboplatin      Bevacizumab-xxxx   **Always confirm dose/schedule in your pharmacy ordering system**  Patient Characteristics: Stage IV Metastatic, Nonsquamous, Did Not Order Molecular Analysis/Quantity Not Sufficient for Molecular Analysis Therapeutic Status: Stage IV Metastatic Histology: Nonsquamous Cell Broad Molecular Profiling Status: Quantity Not Sufficient for Molecular Analysis  Intent of Therapy: Non-Curative / Palliative Intent, Discussed with Patient

## 2021-10-04 NOTE — Progress Notes (Signed)
Met with patient and his sister during follow up visit with Dr. Grayland Ormond. All questions answered during visit. Reviewed upcoming appts and informed sister that she will be called with pt's appt for biopsy once scheduled. Reminded to pick up prescription for decadron that has been sent in and start taking to help with his appetite. Nothing further needed at this time. Instructed to call with any questions or needs. Pt and his sister verbalized understanding.

## 2021-10-05 NOTE — Progress Notes (Signed)
Patient on schedule for Supraclavicular LN biopsy, 10/10/2021, called and spoke with Elsie/sister on phone with pre procedure instructions given. Made aware to be here @ 1230, NPO after 0630, and driver post procedure/recovery/discharge. Stated understanding.

## 2021-10-09 ENCOUNTER — Other Ambulatory Visit: Payer: Self-pay

## 2021-10-09 ENCOUNTER — Inpatient Hospital Stay: Payer: Medicare HMO

## 2021-10-10 ENCOUNTER — Ambulatory Visit
Admission: RE | Admit: 2021-10-10 | Discharge: 2021-10-10 | Disposition: A | Discharge status: Home / Self Care | Payer: Medicare HMO | Source: Ambulatory Visit | Attending: Oncology | Admitting: Oncology

## 2021-10-10 DIAGNOSIS — C770 Secondary and unspecified malignant neoplasm of lymph nodes of head, face and neck: Secondary | ICD-10-CM | POA: Insufficient documentation

## 2021-10-10 DIAGNOSIS — R599 Enlarged lymph nodes, unspecified: Secondary | ICD-10-CM | POA: Insufficient documentation

## 2021-10-10 DIAGNOSIS — R590 Localized enlarged lymph nodes: Secondary | ICD-10-CM | POA: Diagnosis not present

## 2021-10-10 DIAGNOSIS — C3490 Malignant neoplasm of unspecified part of unspecified bronchus or lung: Secondary | ICD-10-CM | POA: Diagnosis not present

## 2021-10-10 DIAGNOSIS — C773 Secondary and unspecified malignant neoplasm of axilla and upper limb lymph nodes: Secondary | ICD-10-CM | POA: Diagnosis not present

## 2021-10-10 DIAGNOSIS — C3491 Malignant neoplasm of unspecified part of right bronchus or lung: Secondary | ICD-10-CM | POA: Insufficient documentation

## 2021-10-10 HISTORY — DX: Malignant neoplasm of unspecified part of unspecified bronchus or lung: C34.90

## 2021-10-12 ENCOUNTER — Inpatient Hospital Stay (HOSPITAL_BASED_OUTPATIENT_CLINIC_OR_DEPARTMENT_OTHER): Payer: Medicare HMO | Admitting: Oncology

## 2021-10-12 ENCOUNTER — Inpatient Hospital Stay: Payer: Medicare HMO

## 2021-10-12 ENCOUNTER — Other Ambulatory Visit: Payer: Self-pay

## 2021-10-12 ENCOUNTER — Encounter: Payer: Self-pay | Admitting: *Deleted

## 2021-10-12 VITALS — BP 147/63 | HR 99 | Temp 96.6°F | Resp 16 | Wt 107.9 lb

## 2021-10-12 VITALS — BP 150/77 | HR 94

## 2021-10-12 DIAGNOSIS — D701 Agranulocytosis secondary to cancer chemotherapy: Secondary | ICD-10-CM | POA: Diagnosis not present

## 2021-10-12 DIAGNOSIS — C3491 Malignant neoplasm of unspecified part of right bronchus or lung: Secondary | ICD-10-CM

## 2021-10-12 DIAGNOSIS — Z5112 Encounter for antineoplastic immunotherapy: Secondary | ICD-10-CM | POA: Diagnosis not present

## 2021-10-12 DIAGNOSIS — I10 Essential (primary) hypertension: Secondary | ICD-10-CM | POA: Diagnosis not present

## 2021-10-12 DIAGNOSIS — Z5111 Encounter for antineoplastic chemotherapy: Secondary | ICD-10-CM | POA: Diagnosis not present

## 2021-10-12 DIAGNOSIS — Z79899 Other long term (current) drug therapy: Secondary | ICD-10-CM | POA: Diagnosis not present

## 2021-10-12 DIAGNOSIS — J910 Malignant pleural effusion: Secondary | ICD-10-CM | POA: Diagnosis not present

## 2021-10-12 DIAGNOSIS — D6481 Anemia due to antineoplastic chemotherapy: Secondary | ICD-10-CM | POA: Diagnosis not present

## 2021-10-12 DIAGNOSIS — F17210 Nicotine dependence, cigarettes, uncomplicated: Secondary | ICD-10-CM | POA: Diagnosis not present

## 2021-10-12 LAB — CBC WITH DIFFERENTIAL/PLATELET
Abs Immature Granulocytes: 0.66 10*3/uL — ABNORMAL HIGH (ref 0.00–0.07)
Basophils Absolute: 0.1 10*3/uL (ref 0.0–0.1)
Basophils Relative: 0 %
Eosinophils Absolute: 0 10*3/uL (ref 0.0–0.5)
Eosinophils Relative: 0 %
HCT: 39.5 % (ref 39.0–52.0)
Hemoglobin: 12.5 g/dL — ABNORMAL LOW (ref 13.0–17.0)
Immature Granulocytes: 2 %
Lymphocytes Relative: 2 %
Lymphs Abs: 0.7 10*3/uL (ref 0.7–4.0)
MCH: 27.5 pg (ref 26.0–34.0)
MCHC: 31.6 g/dL (ref 30.0–36.0)
MCV: 87 fL (ref 80.0–100.0)
Monocytes Absolute: 0.7 10*3/uL (ref 0.1–1.0)
Monocytes Relative: 2 %
Neutro Abs: 31.7 10*3/uL — ABNORMAL HIGH (ref 1.7–7.7)
Neutrophils Relative %: 94 %
Platelets: 456 10*3/uL — ABNORMAL HIGH (ref 150–400)
RBC: 4.54 MIL/uL (ref 4.22–5.81)
RDW: 18.1 % — ABNORMAL HIGH (ref 11.5–15.5)
WBC: 33.8 10*3/uL — ABNORMAL HIGH (ref 4.0–10.5)
nRBC: 0 % (ref 0.0–0.2)

## 2021-10-12 LAB — URINALYSIS, DIPSTICK ONLY
Bilirubin Urine: NEGATIVE
Glucose, UA: NEGATIVE mg/dL
Hgb urine dipstick: NEGATIVE
Ketones, ur: NEGATIVE mg/dL
Leukocytes,Ua: NEGATIVE
Nitrite: NEGATIVE
Specific Gravity, Urine: 1.025 (ref 1.005–1.030)
pH: 6 (ref 5.0–8.0)

## 2021-10-12 LAB — COMPREHENSIVE METABOLIC PANEL
ALT: 64 U/L — ABNORMAL HIGH (ref 0–44)
AST: 45 U/L — ABNORMAL HIGH (ref 15–41)
Albumin: 2.7 g/dL — ABNORMAL LOW (ref 3.5–5.0)
Alkaline Phosphatase: 118 U/L (ref 38–126)
Anion gap: 15 (ref 5–15)
BUN: 17 mg/dL (ref 8–23)
CO2: 30 mmol/L (ref 22–32)
Calcium: 10.1 mg/dL (ref 8.9–10.3)
Chloride: 94 mmol/L — ABNORMAL LOW (ref 98–111)
Creatinine, Ser: 0.61 mg/dL (ref 0.61–1.24)
GFR, Estimated: >60 mL/min (ref >60)
Glucose, Bld: 123 mg/dL — ABNORMAL HIGH (ref 70–99)
Potassium: 3.8 mmol/L (ref 3.5–5.1)
Sodium: 139 mmol/L (ref 135–145)
Total Bilirubin: 0.4 mg/dL (ref 0.3–1.2)
Total Protein: 6.8 g/dL (ref 6.5–8.1)

## 2021-10-12 MED ORDER — SODIUM CHLORIDE 0.9 % IV SOLN
Freq: Once | INTRAVENOUS | Status: AC
  Filled 2021-10-12: qty 250

## 2021-10-12 MED ORDER — SODIUM CHLORIDE 0.9% FLUSH
10.0000 mL | INTRAVENOUS | Status: DC | PRN
  Administered 2021-10-12: 10 mL
  Filled 2021-10-12: qty 10

## 2021-10-12 MED ORDER — SODIUM CHLORIDE 0.9 % IV SOLN
15.0000 mg/kg | Freq: Once | INTRAVENOUS | Status: AC
  Administered 2021-10-12: 700 mg via INTRAVENOUS
  Filled 2021-10-12: qty 16

## 2021-10-12 MED ORDER — DIPHENHYDRAMINE HCL 50 MG/ML IJ SOLN
25.0000 mg | Freq: Once | INTRAMUSCULAR | Status: AC
  Administered 2021-10-12: 25 mg via INTRAVENOUS
  Filled 2021-10-12: qty 1

## 2021-10-12 MED ORDER — HEPARIN SOD (PORK) LOCK FLUSH 100 UNIT/ML IV SOLN
500.0000 [IU] | Freq: Once | INTRAVENOUS | Status: AC | PRN
  Administered 2021-10-12: 500 [IU]
  Filled 2021-10-12: qty 5

## 2021-10-12 MED ORDER — SODIUM CHLORIDE 0.9 % IV SOLN
268.8000 mg | Freq: Once | INTRAVENOUS | Status: AC
  Administered 2021-10-12: 270 mg via INTRAVENOUS
  Filled 2021-10-12: qty 27

## 2021-10-12 MED ORDER — PALONOSETRON HCL INJECTION 0.25 MG/5ML
0.2500 mg | Freq: Once | INTRAVENOUS | Status: AC
  Administered 2021-10-12: 0.25 mg via INTRAVENOUS
  Filled 2021-10-12: qty 5

## 2021-10-12 MED ORDER — FAMOTIDINE 20 MG IN NS 100 ML IVPB
20.0000 mg | Freq: Once | INTRAVENOUS | Status: AC
  Administered 2021-10-12: 20 mg via INTRAVENOUS
  Filled 2021-10-12: qty 100
  Filled 2021-10-12: qty 20

## 2021-10-12 MED ORDER — SODIUM CHLORIDE 0.9 % IV SOLN
175.0000 mg/m2 | Freq: Once | INTRAVENOUS | Status: AC
  Administered 2021-10-12: 270 mg via INTRAVENOUS
  Filled 2021-10-12: qty 45

## 2021-10-12 MED ORDER — SODIUM CHLORIDE 0.9 % IV SOLN
10.0000 mg | Freq: Once | INTRAVENOUS | Status: AC
  Administered 2021-10-12: 10 mg via INTRAVENOUS
  Filled 2021-10-12: qty 10

## 2021-10-12 MED ORDER — SODIUM CHLORIDE 0.9 % IV SOLN
150.0000 mg | Freq: Once | INTRAVENOUS | Status: AC
  Administered 2021-10-12: 150 mg via INTRAVENOUS
  Filled 2021-10-12: qty 150

## 2021-10-12 NOTE — Progress Notes (Signed)
Met with patient during follow up visit to start chemotherapy treatments. All questions answered during visit. Informed pt and his sister that appts will be printed out before he leaves the clinic. Instructed to call with any questions or needs. Pt and his sister verbalized understanding. Nothing further needed at this time.

## 2021-10-12 NOTE — Progress Notes (Signed)
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  INTERVAL HISTORY: Patient returns to clinic today for further evaluation and to initiate cycle 1 of carboplatinum, Taxol, and Avastin.  Patient continues to have weakness and fatigue, but states this is improved.  He reports his appetite has improved as well.  He has shortness of breath requiring oxygen 24 hours/day. He has no neurologic complaints.  He denies any fevers.  He has no chest pain, cough, or hemoptysis.  He denies any nausea, vomiting, constipation, or diarrhea.  He has no urinary complaints.  Patient offers no further specific complaints today.    REVIEW OF SYSTEMS:   Review of Systems  Constitutional:  Positive for malaise/fatigue. Negative for fever and weight loss.  Respiratory:  Positive for shortness of breath. Negative for cough and hemoptysis.   Cardiovascular: Negative.  Negative for chest pain and leg swelling.  Gastrointestinal: Negative.  Negative for abdominal pain.  Genitourinary: Negative.  Negative for dysuria.  Musculoskeletal: Negative.  Negative for back pain.  Skin: Negative.  Negative for rash.  Neurological:  Positive for weakness. Negative for dizziness, focal weakness and headaches.  Psychiatric/Behavioral: Negative.  The patient is not nervous/anxious.    As per HPI. Otherwise, a complete review of systems is negative.  PAST MEDICAL HISTORY: Past Medical History:  Diagnosis Date   Glaucoma    Hyperlipidemia    Hypertension    Lung cancer ([place])     PAST SURGICAL HISTORY: Past Surgical History:  Procedure Laterality Date   EYE SURGERY     IR IMAGING GUIDED PORT INSERTION   09/26/2021    FAMILY HISTORY: Family History  Problem Relation Age of Onset   Varicose Veins Neg Hx    Vision loss Neg Hx     ADVANCED DIRECTIVES (Y/N):  N  HEALTH MAINTENANCE: Social History   Tobacco Use   Smoking status: Former    Types: Cigarettes    Quit date: 04/15/2018    Years since quitting: 3.4   Smokeless tobacco: Never  Vaping Use   Vaping Use: Never used  Substance Use Topics   Alcohol use: Yes   Drug use: No     Colonoscopy:  PAP:  Bone density:  Lipid panel:  No Known Allergies  Current Outpatient Medications  Medication Sig Dispense Refill   albuterol (VENTOLIN HFA) 108 (90 Base) MCG/ACT inhaler Inhale 2 puffs into the lungs every 6 (six) hours as needed for wheezing or shortness of breath. 8 g 2   amLODipine (NORVASC) 5 MG tablet take 1 tablet by mouth once daily     aspirin EC 81 MG tablet Take 81 mg by mouth daily.     atorvastatin (LIPITOR) 10 MG tablet take 1 tablet by mouth once daily     dexamethasone (DECADRON) 4 MG tablet Take 1 tablet (4 mg total) by mouth daily. 30 tablet 3   dorzolamide-timolol (COSOPT) 22.3-6.8 MG/ML ophthalmic solution 1 drop 2 (two) times daily.     lidocaine-prilocaine (EMLA) cream Apply to affected area once 30 g 3   mometasone-formoterol (DULERA) 200-5 MCG/ACT AERO Inhale 2 puffs into the lungs 2 (two) times daily. 13 g 1   nicotine (NICODERM CQ - DOSED IN MG/24  HOURS) 21 mg/24hr patch Place 1 patch (21 mg total) onto the skin daily as needed (nicotine craving). 28 patch 0   ondansetron (ZOFRAN) 8 MG tablet Take 1 tablet (8 mg total) by mouth 2 (two) times daily as needed for refractory nausea / vomiting. 60 tablet 1   prochlorperazine (COMPAZINE) 10 MG tablet Take 1 tablet (10 mg total) by mouth every 6 (six) hours as needed (Nausea or vomiting). 60 tablet 1   tiotropium (SPIRIVA) 18 MCG inhalation capsule Place 1 capsule (18 mcg total) into inhaler and inhale daily. 30 capsule 1   WIXELA INHUB 500-50 MCG/ACT AEPB       No current facility-administered medications for this visit.   Facility-Administered Medications Ordered in Other Visits  Medication Dose Route Frequency Provider Last Rate Last Admin   sodium chloride flush (NS) 0.9 % injection 10 mL  10 mL Intracatheter PRN Lloyd Huger, MD   10 mL at 10/12/21 1013    OBJECTIVE: Vitals:   10/12/21 0903  BP: (!) 147/63  Pulse: 99  Resp: 16  Temp: (!) 96.6 F (35.9 C)  SpO2: 96%     Body mass index is 16.41 kg/m.    ECOG FS:1 - Symptomatic but completely ambulatory  General: Well-developed, well-nourished, no acute distress.  Sitting in a wheelchair. Eyes: Pink conjunctiva, anicteric sclera. HEENT: Normocephalic, moist mucous membranes. Lungs: No audible wheezing or coughing. Heart: Regular rate and rhythm. Abdomen: Soft, nontender, no obvious distention. Musculoskeletal: No edema, cyanosis, or clubbing. Neuro: Alert, answering all questions appropriately. Cranial nerves grossly intact. Skin: No rashes or petechiae noted. Psych: Normal affect.  LAB RESULTS:  Lab Results  Component Value Date   NA 139 10/12/2021   K 3.8 10/12/2021   CL 94 (L) 10/12/2021   CO2 30 10/12/2021   GLUCOSE 123 (H) 10/12/2021   BUN 17 10/12/2021   CREATININE 0.61 10/12/2021   CALCIUM 10.1 10/12/2021   PROT 6.8 10/12/2021   ALBUMIN 2.7 (L) 10/12/2021   AST 45 (H) 10/12/2021   ALT 64 (H) 10/12/2021   ALKPHOS 118 10/12/2021   BILITOT 0.4 10/12/2021   GFRNONAA >60 10/12/2021    Lab Results  Component Value Date   WBC 33.8 (H) 10/12/2021   NEUTROABS 31.7 (H) 10/12/2021   HGB 12.5 (L) 10/12/2021   HCT 39.5 10/12/2021   MCV 87.0 10/12/2021   PLT 456 (H) 10/12/2021     STUDIES: NM PET Image Initial (PI) Skull Base To Thigh  Result Date: 10/03/2021 CLINICAL DATA:  Initial treatment strategy for right lung adenocarcinoma. EXAM: NUCLEAR MEDICINE PET SKULL BASE TO THIGH TECHNIQUE: 5.94 mCi F-18 FDG was injected intravenously. Full-ring PET  imaging was performed from the skull base to thigh after the radiotracer. CT data was obtained and used for attenuation correction and anatomic localization. Fasting blood glucose: 94 mg/dl COMPARISON:  CTs of the chest, abdomen and pelvis 08/31/2021 and 09/01/2021. FINDINGS: Mediastinal blood pool activity: SUV max 1.5 NECK: There are hypermetabolic right supraclavicular lymph nodes, measuring up to 1.2 cm on image 70/3 (SUV max 10.7. No hypermetabolic left cervical lymph nodes.There are no lesions of the pharyngeal mucosal space. Incidental CT findings: Extensive bilateral carotid atherosclerosis. CHEST: There are multiple enlarged and hypermetabolic mediastinal and hilar lymph nodes bilaterally. A large subcarinal nodal mass extends into the AP window and demonstrates an SUV max of 27.1. There is a small hypermetabolic right axillary lymph nodes. The irregular nodular pleural, fissural and septal thickening throughout the right hemithorax on recent CT  demonstrates extensive hypermetabolic activity. For example, medially at the right apex, there is an SUV max of 18.9. There is also some hypermetabolic activity within the left pleural space, most notably posterior to the midthoracic aorta (SUV max 6.2). There is a hypermetabolic perifissural nodule along the left major fissure (SUV max 2.2) left apical density measuring approximately 3.3 x 1.6 cm on image 77/3 is also mildly hypermetabolic (4.0). Based on morphology, this is nonspecific and potentially postinflammatory. Incidental CT findings: Underlying severe centrilobular and paraseptal emphysema. Small left greater than right pleural effusions. Diffuse atherosclerosis of the aorta, great vessels and coronary arteries. Left IJ Port-A-Cath extends to the superior cavoatrial junction. ABDOMEN/PELVIS: No definite hypermetabolic activity within the liver, adrenal glands, spleen or pancreas. However, there are several hypermetabolic lymph nodes within the  gastrohepatic ligament and upper retroperitoneum. For example, and the right retrocrural space, there is hypermetabolic activity with an SUV max of 8.8. No hypermetabolic nodes are identified in the pelvis. No bowel lesions identified. Incidental CT findings: Aortic and branch vessel atherosclerosis. Scattered diverticular changes throughout the colon. No ascites or peritoneal nodularity. SKELETON: Scattered small hypermetabolic osseous metastases are noted throughout the ribs, spine and bony pelvis. For example, there is a lesion in the right aspect of the L3 vertebral body which has an SUV max of 11.9. No evidence of pathologic fracture. Incidental CT findings: Thoracolumbar spondylosis, most advanced at L4-5. IMPRESSION: 1. 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 2. In addition, there is a small contralateral left pleural effusion with associated mild hypermetabolic activity, suspicious for contralateral metastatic disease. 3. Multiple nodal metastases within the mediastinum, bilateral hilar regions, right supraclavicular, right axillary and upper abdominal stations. 4. No solid visceral organ metastases identified in the abdomen or pelvis. 5. Scattered small osseous metastases. Electronically Signed   By: Richardean Sale M.D.   On: 10/03/2021 16:58   [place] CORE BIOPSY (LYMPH NODES)  Result Date: 10/10/2021 INDICATION: Lung cancer, right supraclavicular adenopathy EXAM: Ultrasound-guided core needle biopsy of right supraclavicular lymph node MEDICATIONS: None. ANESTHESIA/SEDATION: Local analgesia FLUOROSCOPY TIME:  N/a COMPLICATIONS: None immediate. PROCEDURE: Informed written consent was obtained from the patient after a thorough discussion of the procedural risks, benefits and alternatives. All questions were addressed. Maximal Sterile Barrier Technique was  utilized including caps, mask, sterile gowns, sterile gloves, sterile drape, hand hygiene and skin antiseptic. A timeout was performed prior to the initiation of the procedure. The patient was placed supine on the exam table. Limited ultrasound of the right lower neck and supraclavicular region was performed, demonstrating abnormal soft tissue compatible with pathologic/conglomerate lymphadenopathy as described on recent PET-CT. Skin entry site was marked, and local analgesia was obtained with 1% lidocaine. Using ultrasound guidance, core needle biopsy was performed of the abnormal soft tissue in the right supraclavicular region using an 18 gauge core biopsy device x4 passes. Adequate tissue sampling was slightly challenging due to the heterogeneous nature of the tissue in question. All specimens were submitted in formalin to pathology for further handling. Limited postprocedure imaging demonstrated no hematoma or complicating feature. A clean dressing was placed after manual hemostasis. The patient tolerated the procedure well without immediate complication. IMPRESSION: Successful ultrasound-guided core needle biopsy of right supraclavicular lymphadenopathy. Electronically Signed   By: Albin Felling M.D.   On: 10/10/2021 15:57   IR IMAGING GUIDED PORT INSERTION  Result Date: 09/26/2021 INDICATION: port placement lung cancer EXAM:  Ultrasound-guided puncture of the left internal jugular vein Placement of a left-sided chest port using fluoroscopic guidance MEDICATIONS: None ANESTHESIA/SEDATION: Moderate (conscious) sedation was employed during this procedure. A total of Versed 0.5 mg and Fentanyl 50 mcg was administered intravenously. Moderate Sedation Time: 26 minutes. The patient's level of consciousness and vital signs were monitored continuously by radiology nursing throughout the procedure under my direct supervision. FLUOROSCOPY TIME:  Fluoroscopy Time: 1 minute with 3 exposures COMPLICATIONS: None  immediate. PROCEDURE: Informed written consent was obtained from the patient after a thorough discussion of the procedural risks, benefits and alternatives. All questions were addressed. Maximal Sterile Barrier Technique was utilized including caps, mask, sterile gowns, sterile gloves, sterile drape, hand hygiene and skin antiseptic. A timeout was performed prior to the initiation of the procedure. The patient was placed supine on the exam table. The left neck and chest was prepped and draped in the standard sterile fashion. A preliminary ultrasound of the left neck was performed and demonstrates a patent left internal jugular vein. A permanent ultrasound image was stored in the electronic medical record. The overlying skin was anesthetized with 1% Lidocaine. Using ultrasound guidance, access was obtained into the left internal jugular vein using a 21 gauge micropuncture set. A wire was advanced into the SVC, a short incision was made at the puncture site, and serial dilatation performed. Next, in an ipsilateral infraclavicular location, an incision was made at the site of the subcutaneous reservoir. Blunt dissection was used to open a pocket to contain the reservoir. A subcutaneous tunnel was then created from the port site to the puncture site. A(n) 8 Fr single lumen catheter was advanced through the tunnel. The catheter was attached to the port and this was placed in the subcutaneous pocket. Under fluoroscopic guidance, a peel away sheath was placed, and the catheter was trimmed to the appropriate length and was advanced into the central veins. The catheter length is 32 cm. The tip of the catheter lies near the superior cavoatrial junction. The port flushes and aspirates appropriately. The port was flushed and locked with heparinized saline. The port pocket was closed in 2 layers using 3-0 and 4-0 Vicryl/absorbable suture. Dermabond was also applied to both incisions. The patient tolerated the procedure well and  was transferred to recovery in stable condition. IMPRESSION: Successful placement of a left chest port using the left internal jugular vein. The port is ready for immediate use. Electronically Signed   By: Albin Felling M.D.   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  Patient also noted to have right hilar, mediastinal, and right neck lymphadenopathy.  PET scan results from October 02, 2021 reviewed independently and report as above confirming stage and anatomic spread of disease.   Thoracentesis on August 31, 2021 confirmed adenocarcinoma, likely of lung origin.  Unfortunately there was not enough tissue or additional testing.  MRI of the brain on September 01, 2021 did not reveal any metastatic disease.  Despite patient's decreased performance status, he wishes to attempt systemic chemotherapy with carboplatinum, Taxol, and Avastin.  He has also agreed to ultrasound-guided biopsy of right supraclavicular lymph node in an effort to obtain more tissue for ancillary testing.  Patient has had port placement.  Proceed with cycle 1 of dose reduced carboplatinum, Taxol, and Avastin today.  Return to clinic in 1 week for laboratory work and further evaluation and then in 3 weeks for laboratory work, further evaluation, and consideration of cycle 2.   Shortness of breath: Chronic and unchanged.  Continue oxygen as prescribed.   Poor appetite: Appreciate dietary input.  Continue dexamethasone 4 mg daily. Leukocytosis: Likely secondary to steroid use.  Monitor.  Patient does not require Neulasta at this time, but would consider in the future if patient became neutropenic. Transaminitis: Mild,  monitor.   Patient expressed understanding and was in agreement with this plan. He also understands that He can call clinic at any time with any questions, concerns, or complaints.    Cancer Staging  Adenocarcinoma of right lung [facility]) Staging form: Lung, AJCC 8th Edition - Clinical: Stage IVA (cT2, cN2, cM1a) - Signed by Lloyd Huger, MD on 09/18/2021  Lloyd Huger, MD   10/12/2021 4:53 PM

## 2021-10-12 NOTE — Progress Notes (Signed)
Pt has no questions or concerns at this time. Pt was unable to provide urine specimen at this time.

## 2021-10-12 NOTE — Patient Instructions (Signed)
[facility] CANCER CTR AT [place]  Discharge Instructions: Thank you for choosing [facility] to provide your oncology and hematology care.  If you have a lab appointment with the [facility], please go directly to the [facility] and check in at the registration area.  Wear comfortable clothing and clothing appropriate for easy access to any Portacath or PICC line.   We strive to give you quality time with your provider. You may need to reschedule your appointment if you arrive late (15 or more minutes).  Arriving late affects you and other patients whose appointments are after yours.  Also, if you miss three or more appointments without notifying the office, you may be dismissed from the clinic at the providers discretion.      For prescription refill requests, have your pharmacy contact our office and allow 72 hours for refills to be completed.    Today you received the following chemotherapy and/or immunotherapy agents: Avastin, Taxol, Carboplatin      To help prevent nausea and vomiting after your treatment, we encourage you to take your nausea medication as directed.  BELOW ARE SYMPTOMS THAT SHOULD BE REPORTED IMMEDIATELY: *FEVER GREATER THAN 100.4 F (38 C) OR HIGHER *CHILLS OR SWEATING *NAUSEA AND VOMITING THAT IS NOT CONTROLLED WITH YOUR NAUSEA MEDICATION *UNUSUAL SHORTNESS OF BREATH *UNUSUAL BRUISING OR BLEEDING *URINARY PROBLEMS (pain or burning when urinating, or frequent urination) *BOWEL PROBLEMS (unusual diarrhea, constipation, pain near the anus) TENDERNESS IN MOUTH AND THROAT WITH OR WITHOUT PRESENCE OF ULCERS (sore throat, sores in mouth, or a toothache) UNUSUAL RASH, SWELLING OR PAIN  UNUSUAL VAGINAL DISCHARGE OR ITCHING   Items with * indicate a potential emergency and should be followed up as soon as possible or go to the Emergency Department if any problems should occur.  Please show the CHEMOTHERAPY ALERT CARD or IMMUNOTHERAPY ALERT  CARD at check-in to the Emergency Department and triage nurse.  Should you have questions after your visit or need to cancel or reschedule your appointment, please contact [facility] CANCER [place] AT [place]  [phone] and follow the prompts.  Office hours are 8:00 a.m. to 4:30 p.m. Monday - Friday. Please note that voicemails left after 4:00 p.m. may not be returned until the following business day.  We are closed weekends and major holidays. You have access to a nurse at all times for urgent questions. Please call the main number to the clinic [phone] and follow the prompts.  For any non-urgent questions, you may also contact your provider using MyChart. We now offer e-Visits for anyone 60 and older to request care online for non-urgent symptoms. For details visit [email or web address].   Also download the MyChart app! Go to the app store, search "MyChart", open the app, select [place], and log in with your MyChart username and password.  Due to Covid, a mask is required upon entering the hospital/clinic. If you do not have a mask, one will be given to you upon arrival. For doctor visits, patients may have 1 support person aged 59 or older with them. For treatment visits, patients cannot have anyone with them due to current Covid guidelines and our immunocompromised population. Carboplatin injection What is this medication? CARBOPLATIN (KAR boe pla tin) is a chemotherapy drug. It targets fast dividing cells, like cancer cells, and causes these cells to die. This medicine is used to treat ovarian cancer and many other cancers. This medicine may be used for other purposes; ask your health care provider or  pharmacist if you have questions. COMMON BRAND NAME(S): Paraplatin What should I tell my care team before I take this medication? They need to know if you have any of these conditions: blood disorders hearing problems kidney disease recent or ongoing radiation therapy an  unusual or allergic reaction to carboplatin, cisplatin, other chemotherapy, other medicines, foods, dyes, or preservatives pregnant or trying to get pregnant breast-feeding How should I use this medication? This drug is usually given as an infusion into a vein. It is administered in a hospital or clinic by a specially trained health care professional. Talk to your pediatrician regarding the use of this medicine in children. Special care may be needed. Overdosage: If you think you have taken too much of this medicine contact a poison control center or emergency room at once. NOTE: This medicine is only for you. Do not share this medicine with others. What if I miss a dose? It is important not to miss a dose. Call your doctor or health care professional if you are unable to keep an appointment. What may interact with this medication? medicines for seizures medicines to increase blood counts like filgrastim, pegfilgrastim, sargramostim some antibiotics like amikacin, gentamicin, neomycin, streptomycin, tobramycin vaccines Talk to your doctor or health care professional before taking any of these medicines: acetaminophen aspirin ibuprofen ketoprofen naproxen This list may not describe all possible interactions. Give your health care provider a list of all the medicines, herbs, non-prescription drugs, or dietary supplements you use. Also tell them if you smoke, drink alcohol, or use illegal drugs. Some items may interact with your medicine. What should I watch for while using this medication? Your condition will be monitored carefully while you are receiving this medicine. You will need important blood work done while you are taking this medicine. This drug may make you feel generally unwell. This is not uncommon, as chemotherapy can affect healthy cells as well as cancer cells. Report any side effects. Continue your course of treatment even though you feel ill unless your doctor tells you to  stop. In some cases, you may be given additional medicines to help with side effects. Follow all directions for their use. Call your doctor or health care professional for advice if you get a fever, chills or sore throat, or other symptoms of a cold or flu. Do not treat yourself. This drug decreases your body's ability to fight infections. Try to avoid being around people who are sick. This medicine may increase your risk to bruise or bleed. Call your doctor or health care professional if you notice any unusual bleeding. Be careful brushing and flossing your teeth or using a toothpick because you may get an infection or bleed more easily. If you have any dental work done, tell your dentist you are receiving this medicine. Avoid taking products that contain aspirin, acetaminophen, ibuprofen, naproxen, or ketoprofen unless instructed by your doctor. These medicines may hide a fever. Do not become pregnant while taking this medicine. Women should inform their doctor if they wish to become pregnant or think they might be pregnant. There is a potential for serious side effects to an unborn child. Talk to your health care professional or pharmacist for more information. Do not breast-feed an infant while taking this medicine. What side effects may I notice from receiving this medication? Side effects that you should report to your doctor or health care professional as soon as possible: allergic reactions like skin rash, itching or hives, swelling of the face, lips, or tongue  signs of infection - fever or chills, cough, sore throat, pain or difficulty passing urine signs of decreased platelets or bleeding - bruising, pinpoint red spots on the skin, black, tarry stools, nosebleeds signs of decreased red blood cells - unusually weak or tired, fainting spells, lightheadedness breathing problems changes in hearing changes in vision chest pain high blood pressure low blood counts - This drug may decrease the  number of white blood cells, red blood cells and platelets. You may be at increased risk for infections and bleeding. nausea and vomiting pain, swelling, redness or irritation at the injection site pain, tingling, numbness in the hands or feet problems with balance, talking, walking trouble passing urine or change in the amount of urine Side effects that usually do not require medical attention (report to your doctor or health care professional if they continue or are bothersome): hair loss loss of appetite metallic taste in the mouth or changes in taste This list may not describe all possible side effects. Call your doctor for medical advice about side effects. You may report side effects to FDA at 1-800-FDA-1088. Where should I keep my medication? This drug is given in a hospital or clinic and will not be stored at home. NOTE: This sheet is a summary. It may not cover all possible information. If you have questions about this medicine, talk to your doctor, pharmacist, or health care provider.  2022 Elsevier/Gold Standard (2008-03-24 00:00:00) Paclitaxel injection What is this medication? PACLITAXEL (PAK li TAX el) is a chemotherapy drug. It targets fast dividing cells, like cancer cells, and causes these cells to die. This medicine is used to treat ovarian cancer, breast cancer, lung cancer, Kaposi's sarcoma, and other cancers. This medicine may be used for other purposes; ask your health care provider or pharmacist if you have questions. COMMON BRAND NAME(S): Onxol, Taxol What should I tell my care team before I take this medication? They need to know if you have any of these conditions: history of irregular heartbeat liver disease low blood counts, like low white cell, platelet, or red cell counts lung or breathing disease, like asthma tingling of the fingers or toes, or other nerve disorder an unusual or allergic reaction to paclitaxel, alcohol, polyoxyethylated castor oil, other  chemotherapy, other medicines, foods, dyes, or preservatives pregnant or trying to get pregnant breast-feeding How should I use this medication? This drug is given as an infusion into a vein. It is administered in a hospital or clinic by a specially trained health care professional. Talk to your pediatrician regarding the use of this medicine in children. Special care may be needed. Overdosage: If you think you have taken too much of this medicine contact a poison control center or emergency room at once. NOTE: This medicine is only for you. Do not share this medicine with others. What if I miss a dose? It is important not to miss your dose. Call your doctor or health care professional if you are unable to keep an appointment. What may interact with this medication? Do not take this medicine with any of the following medications: live virus vaccines This medicine may also interact with the following medications: antiviral medicines for hepatitis, HIV or AIDS certain antibiotics like erythromycin and clarithromycin certain medicines for fungal infections like ketoconazole and itraconazole certain medicines for seizures like carbamazepine, phenobarbital, phenytoin gemfibrozil nefazodone rifampin St. John's wort This list may not describe all possible interactions. Give your health care provider a list of all the medicines, herbs, non-prescription drugs,  or dietary supplements you use. Also tell them if you smoke, drink alcohol, or use illegal drugs. Some items may interact with your medicine. What should I watch for while using this medication? Your condition will be monitored carefully while you are receiving this medicine. You will need important blood work done while you are taking this medicine. This medicine can cause serious allergic reactions. To reduce your risk you will need to take other medicine(s) before treatment with this medicine. If you experience allergic reactions like skin  rash, itching or hives, swelling of the face, lips, or tongue, tell your doctor or health care professional right away. In some cases, you may be given additional medicines to help with side effects. Follow all directions for their use. This drug may make you feel generally unwell. This is not uncommon, as chemotherapy can affect healthy cells as well as cancer cells. Report any side effects. Continue your course of treatment even though you feel ill unless your doctor tells you to stop. Call your doctor or health care professional for advice if you get a fever, chills or sore throat, or other symptoms of a cold or flu. Do not treat yourself. This drug decreases your body's ability to fight infections. Try to avoid being around people who are sick. This medicine may increase your risk to bruise or bleed. Call your doctor or health care professional if you notice any unusual bleeding. Be careful brushing and flossing your teeth or using a toothpick because you may get an infection or bleed more easily. If you have any dental work done, tell your dentist you are receiving this medicine. Avoid taking products that contain aspirin, acetaminophen, ibuprofen, naproxen, or ketoprofen unless instructed by your doctor. These medicines may hide a fever. Do not become pregnant while taking this medicine. Women should inform their doctor if they wish to become pregnant or think they might be pregnant. There is a potential for serious side effects to an unborn child. Talk to your health care professional or pharmacist for more information. Do not breast-feed an infant while taking this medicine. Men are advised not to father a child while receiving this medicine. This product may contain alcohol. Ask your pharmacist or healthcare provider if this medicine contains alcohol. Be sure to tell all healthcare providers you are taking this medicine. Certain medicines, like metronidazole and disulfiram, can cause an unpleasant  reaction when taken with alcohol. The reaction includes flushing, headache, nausea, vomiting, sweating, and increased thirst. The reaction can last from 30 minutes to several hours. What side effects may I notice from receiving this medication? Side effects that you should report to your doctor or health care professional as soon as possible: allergic reactions like skin rash, itching or hives, swelling of the face, lips, or tongue breathing problems changes in vision fast, irregular heartbeat high or low blood pressure mouth sores pain, tingling, numbness in the hands or feet signs of decreased platelets or bleeding - bruising, pinpoint red spots on the skin, black, tarry stools, blood in the urine signs of decreased red blood cells - unusually weak or tired, feeling faint or lightheaded, falls signs of infection - fever or chills, cough, sore throat, pain or difficulty passing urine signs and symptoms of liver injury like dark yellow or brown urine; general ill feeling or flu-like symptoms; light-colored stools; loss of appetite; nausea; right upper belly pain; unusually weak or tired; yellowing of the eyes or skin swelling of the ankles, feet, hands unusually slow heartbeat  Side effects that usually do not require medical attention (report to your doctor or health care professional if they continue or are bothersome): diarrhea hair loss loss of appetite muscle or joint pain nausea, vomiting pain, redness, or irritation at site where injected tiredness This list may not describe all possible side effects. Call your doctor for medical advice about side effects. You may report side effects to FDA at 1-800-FDA-1088. Where should I keep my medication? This drug is given in a hospital or clinic and will not be stored at home. NOTE: This sheet is a summary. It may not cover all possible information. If you have questions about this medicine, talk to your doctor, pharmacist, or health care  provider.  2022 Elsevier/Gold Standard (2021-07-04 00:00:00) Bevacizumab injection What is this medication? BEVACIZUMAB (be va SIZ yoo mab) is a monoclonal antibody. It is used to treat many types of cancer. This medicine may be used for other purposes; ask your health care provider or pharmacist if you have questions. COMMON BRAND NAME(S): Alymsys, Avastin, MVASI, Noah Charon What should I tell my care team before I take this medication? They need to know if you have any of these conditions: diabetes heart disease high blood pressure history of coughing up blood prior anthracycline chemotherapy (e.g., doxorubicin, daunorubicin, epirubicin) recent or ongoing radiation therapy recent or planning to have surgery stroke an unusual or allergic reaction to bevacizumab, hamster proteins, mouse proteins, other medicines, foods, dyes, or preservatives pregnant or trying to get pregnant breast-feeding How should I use this medication? This medicine is for infusion into a vein. It is given by a health care professional in a hospital or clinic setting. Talk to your pediatrician regarding the use of this medicine in children. Special care may be needed. Overdosage: If you think you have taken too much of this medicine contact a poison control center or emergency room at once. NOTE: This medicine is only for you. Do not share this medicine with others. What if I miss a dose? It is important not to miss your dose. Call your doctor or health care professional if you are unable to keep an appointment. What may interact with this medication? Interactions are not expected. This list may not describe all possible interactions. Give your health care provider a list of all the medicines, herbs, non-prescription drugs, or dietary supplements you use. Also tell them if you smoke, drink alcohol, or use illegal drugs. Some items may interact with your medicine. What should I watch for while using this  medication? Your condition will be monitored carefully while you are receiving this medicine. You will need important blood work and urine testing done while you are taking this medicine. This medicine may increase your risk to bruise or bleed. Call your doctor or health care professional if you notice any unusual bleeding. Before having surgery, talk to your health care provider to make sure it is ok. This drug can increase the risk of poor healing of your surgical site or wound. You will need to stop this drug for 28 days before surgery. After surgery, wait at least 28 days before restarting this drug. Make sure the surgical site or wound is healed enough before restarting this drug. Talk to your health care provider if questions. Do not become pregnant while taking this medicine or for 6 months after stopping it. Women should inform their doctor if they wish to become pregnant or think they might be pregnant. There is a potential for serious side effects to  an unborn child. Talk to your health care professional or pharmacist for more information. Do not breast-feed an infant while taking this medicine and for 6 months after the last dose. This medicine has caused ovarian failure in some women. This medicine may interfere with the ability to have a child. You should talk to your doctor or health care professional if you are concerned about your fertility. What side effects may I notice from receiving this medication? Side effects that you should report to your doctor or health care professional as soon as possible: allergic reactions like skin rash, itching or hives, swelling of the face, lips, or tongue chest pain or chest tightness chills coughing up blood high fever seizures severe constipation signs and symptoms of bleeding such as bloody or black, tarry stools; red or dark-brown urine; spitting up blood or brown material that looks like coffee grounds; red spots on the skin; unusual bruising or  bleeding from the eye, gums, or nose signs and symptoms of a blood clot such as breathing problems; chest pain; severe, sudden headache; pain, swelling, warmth in the leg signs and symptoms of a stroke like changes in vision; confusion; trouble speaking or understanding; severe headaches; sudden numbness or weakness of the face, arm or leg; trouble walking; dizziness; loss of balance or coordination stomach pain sweating swelling of legs or ankles vomiting weight gain Side effects that usually do not require medical attention (report to your doctor or health care professional if they continue or are bothersome): back pain changes in taste decreased appetite dry skin nausea tiredness This list may not describe all possible side effects. Call your doctor for medical advice about side effects. You may report side effects to FDA at 1-800-FDA-1088. Where should I keep my medication? This drug is given in a hospital or clinic and will not be stored at home. NOTE: This sheet is a summary. It may not cover all possible information. If you have questions about this medicine, talk to your doctor, pharmacist, or health care provider.  2022 Elsevier/Gold Standard (2021-07-04 00:00:00)

## 2021-10-13 ENCOUNTER — Other Ambulatory Visit: Payer: Self-pay | Admitting: Oncology

## 2021-10-13 DIAGNOSIS — C3491 Malignant neoplasm of unspecified part of right bronchus or lung: Secondary | ICD-10-CM

## 2021-10-13 LAB — SURGICAL PATHOLOGY

## 2021-10-16 ENCOUNTER — Encounter: Payer: Self-pay | Admitting: *Deleted

## 2021-10-16 ENCOUNTER — Telehealth: Payer: Self-pay | Admitting: *Deleted

## 2021-10-16 NOTE — Progress Notes (Signed)
Request for Omniseq on right supraclavicular lymph node sample faxed to pathology.

## 2021-10-16 NOTE — Telephone Encounter (Signed)
I personally contacted the patient after his first chemotherapy (carbo/taxol/bevacizumab) treatment on Friday 10/13/21.  Pt stated that he tolerated his chemotherapy well. He is not having any problems or concerns. He has not had to use any antiemetics this weekend. Patient advised to contact our office should he have any problems or concerns. Pt thanked me for calling him.

## 2021-10-17 ENCOUNTER — Other Ambulatory Visit: Payer: Self-pay | Admitting: Nurse Practitioner

## 2021-10-17 ENCOUNTER — Other Ambulatory Visit: Payer: Self-pay

## 2021-10-17 ENCOUNTER — Telehealth: Payer: Self-pay | Admitting: Nurse Practitioner

## 2021-10-17 NOTE — Telephone Encounter (Signed)
Spoke with patient's sister Cody Williams, to see if we could reschedule the Palliative Consult scheduled for today, to Friday 10/20/21 @ 9 AM, and she was in agreement with this.

## 2021-10-19 ENCOUNTER — Encounter: Payer: Self-pay | Admitting: *Deleted

## 2021-10-19 ENCOUNTER — Other Ambulatory Visit: Payer: Self-pay

## 2021-10-19 ENCOUNTER — Inpatient Hospital Stay (HOSPITAL_BASED_OUTPATIENT_CLINIC_OR_DEPARTMENT_OTHER): Payer: Medicare HMO | Admitting: Oncology

## 2021-10-19 ENCOUNTER — Inpatient Hospital Stay: Payer: Medicare HMO

## 2021-10-19 ENCOUNTER — Encounter: Payer: Self-pay | Admitting: Oncology

## 2021-10-19 VITALS — BP 128/76 | HR 106 | Temp 97.0°F | Ht 68.0 in | Wt 107.9 lb

## 2021-10-19 DIAGNOSIS — E871 Hypo-osmolality and hyponatremia: Secondary | ICD-10-CM | POA: Diagnosis not present

## 2021-10-19 DIAGNOSIS — Z79899 Other long term (current) drug therapy: Secondary | ICD-10-CM | POA: Diagnosis not present

## 2021-10-19 DIAGNOSIS — I10 Essential (primary) hypertension: Secondary | ICD-10-CM | POA: Diagnosis not present

## 2021-10-19 DIAGNOSIS — J910 Malignant pleural effusion: Secondary | ICD-10-CM | POA: Diagnosis not present

## 2021-10-19 DIAGNOSIS — C3491 Malignant neoplasm of unspecified part of right bronchus or lung: Secondary | ICD-10-CM

## 2021-10-19 DIAGNOSIS — Z5111 Encounter for antineoplastic chemotherapy: Secondary | ICD-10-CM | POA: Diagnosis not present

## 2021-10-19 DIAGNOSIS — Z5112 Encounter for antineoplastic immunotherapy: Secondary | ICD-10-CM | POA: Diagnosis not present

## 2021-10-19 DIAGNOSIS — D701 Agranulocytosis secondary to cancer chemotherapy: Secondary | ICD-10-CM | POA: Diagnosis not present

## 2021-10-19 DIAGNOSIS — D6481 Anemia due to antineoplastic chemotherapy: Secondary | ICD-10-CM | POA: Diagnosis not present

## 2021-10-19 DIAGNOSIS — F17210 Nicotine dependence, cigarettes, uncomplicated: Secondary | ICD-10-CM | POA: Diagnosis not present

## 2021-10-19 LAB — COMPREHENSIVE METABOLIC PANEL
ALT: 62 U/L — ABNORMAL HIGH (ref 0–44)
AST: 46 U/L — ABNORMAL HIGH (ref 15–41)
Albumin: 2.5 g/dL — ABNORMAL LOW (ref 3.5–5.0)
Alkaline Phosphatase: 95 U/L (ref 38–126)
Anion gap: 12 (ref 5–15)
BUN: 15 mg/dL (ref 8–23)
CO2: 28 mmol/L (ref 22–32)
Calcium: 9.1 mg/dL (ref 8.9–10.3)
Chloride: 91 mmol/L — ABNORMAL LOW (ref 98–111)
Creatinine, Ser: 0.58 mg/dL — ABNORMAL LOW (ref 0.61–1.24)
GFR, Estimated: >60 mL/min (ref >60)
Glucose, Bld: 114 mg/dL — ABNORMAL HIGH (ref 70–99)
Potassium: 5.1 mmol/L (ref 3.5–5.1)
Sodium: 131 mmol/L — ABNORMAL LOW (ref 135–145)
Total Bilirubin: 1 mg/dL (ref 0.3–1.2)
Total Protein: 6.2 g/dL — ABNORMAL LOW (ref 6.5–8.1)

## 2021-10-19 LAB — CBC WITH DIFFERENTIAL/PLATELET
Abs Immature Granulocytes: 0.03 10*3/uL (ref 0.00–0.07)
Basophils Absolute: 0 10*3/uL (ref 0.0–0.1)
Basophils Relative: 1 %
Eosinophils Absolute: 0 10*3/uL (ref 0.0–0.5)
Eosinophils Relative: 0 %
HCT: 34.7 % — ABNORMAL LOW (ref 39.0–52.0)
Hemoglobin: 11.3 g/dL — ABNORMAL LOW (ref 13.0–17.0)
Immature Granulocytes: 1 %
Lymphocytes Relative: 9 %
Lymphs Abs: 0.3 10*3/uL — ABNORMAL LOW (ref 0.7–4.0)
MCH: 27.4 pg (ref 26.0–34.0)
MCHC: 32.6 g/dL (ref 30.0–36.0)
MCV: 84.2 fL (ref 80.0–100.0)
Monocytes Absolute: 0.1 10*3/uL (ref 0.1–1.0)
Monocytes Relative: 2 %
Neutro Abs: 3.3 10*3/uL (ref 1.7–7.7)
Neutrophils Relative %: 87 %
Platelets: 213 10*3/uL (ref 150–400)
RBC: 4.12 MIL/uL — ABNORMAL LOW (ref 4.22–5.81)
RDW: 18 % — ABNORMAL HIGH (ref 11.5–15.5)
WBC: 3.8 10*3/uL — ABNORMAL LOW (ref 4.0–10.5)
nRBC: 0 % (ref 0.0–0.2)

## 2021-10-19 LAB — PSA: Prostatic Specific Antigen: 2.57 ng/mL (ref 0.00–4.00)

## 2021-10-19 NOTE — Progress Notes (Signed)
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  INTERVAL HISTORY: Mr. Cody Williams is a 80 year old male with past medical history significant for hypertension, COPD, hyperlipidemia, cigarette smoker who is followed by Dr. Grayland Williams for stage IV lung cancer.  He is currently undergoing chemotherapy with [place] Taxol Avastin.  He is status post 1 cycle which she received last week.  He is here today for follow-up and to assess lab work given baseline performance status.  Patient states he tolerated treatment well.  Denies needing any prescription medications since his treatment.  Appetite is stable although he does not eat meals but prefers snacking throughout the day.  Has a persistent cough that is chronic and stable.  No nausea or vomiting.  Bowel movements are stable.  Weight is stable.  No pain.  REVIEW OF SYSTEMS:   Review of Systems  Constitutional:  Positive for malaise/fatigue. Negative for chills, fever and weight loss.  HENT:  Negative for congestion, ear pain and tinnitus.   Eyes: Negative.  Negative for blurred vision and double vision.  Respiratory:  Positive for cough and shortness of breath. Negative for sputum production.   Cardiovascular: Negative.  Negative for chest pain, palpitations and leg swelling.  Gastrointestinal: Negative.  Negative for abdominal pain, constipation, diarrhea, nausea and vomiting.  Genitourinary:  Negative for dysuria, frequency and urgency.  Musculoskeletal:  Negative for back pain and falls.  Skin: Negative.  Negative for rash.  Neurological:  Positive for weakness. Negative for headaches.  Endo/Heme/Allergies: Negative.  Does not  bruise/bleed easily.  Psychiatric/Behavioral: Negative.  Negative for depression. The patient is not nervous/anxious and does not have insomnia.    As per HPI. Otherwise, a complete review of systems is negative.  PAST MEDICAL HISTORY: Past Medical History:  Diagnosis Date   Glaucoma    Hyperlipidemia    Hypertension    Lung cancer ([place])     PAST SURGICAL HISTORY: Past Surgical History:  Procedure Laterality Date   EYE SURGERY     IR IMAGING GUIDED PORT INSERTION  09/26/2021    FAMILY HISTORY: Family History  Problem Relation Age of Onset   Varicose Veins Neg Hx    Vision loss Neg Hx     ADVANCED DIRECTIVES (Y/N):  N  HEALTH MAINTENANCE: Social History   Tobacco Use   Smoking status: Former    Types: Cigarettes    Quit date: 04/15/2018    Years since quitting: 3.5   Smokeless tobacco: Never  Vaping Use   Vaping Use: Never used  Substance Use Topics   Alcohol use: Yes   Drug use: No     Colonoscopy:  PAP:  Bone density:  Lipid panel:  No Known Allergies  Current Outpatient Medications  Medication Sig Dispense Refill   albuterol (VENTOLIN HFA) 108 (90 Base) MCG/ACT inhaler Inhale 2 puffs into the lungs every 6 (six) hours as needed for wheezing or shortness of breath. 8 g 2   amLODipine (NORVASC) 5 MG tablet take 1 tablet by mouth once daily     aspirin EC 81 MG tablet Take 81 mg by mouth daily.     atorvastatin (LIPITOR) 10 MG tablet take 1 tablet by mouth once daily  dexamethasone (DECADRON) 4 MG tablet Take 1 tablet (4 mg total) by mouth daily. 30 tablet 3   dorzolamide-timolol (COSOPT) 22.3-6.8 MG/ML ophthalmic solution 1 drop 2 (two) times daily.     lidocaine-prilocaine (EMLA) cream Apply to affected area once 30 g 3   mometasone-formoterol (DULERA) 200-5 MCG/ACT AERO Inhale 2 puffs into the lungs 2 (two) times daily. 13 g 1   ondansetron (ZOFRAN) 8 MG tablet Take 1 tablet (8 mg total) by mouth 2 (two) times daily as needed for refractory nausea /  vomiting. 60 tablet 1   prochlorperazine (COMPAZINE) 10 MG tablet Take 1 tablet (10 mg total) by mouth every 6 (six) hours as needed (Nausea or vomiting). 60 tablet 1   tiotropium (SPIRIVA) 18 MCG inhalation capsule Place 1 capsule (18 mcg total) into inhaler and inhale daily. 30 capsule 1   WIXELA INHUB 500-50 MCG/ACT AEPB      nicotine (NICODERM CQ - DOSED IN MG/24 HOURS) 21 mg/24hr patch Place 1 patch (21 mg total) onto the skin daily as needed (nicotine craving). (Patient not taking: Reported on 10/19/2021) 28 patch 0   No current facility-administered medications for this visit.    OBJECTIVE: Vitals:   10/19/21 0957  BP: 128/76  Pulse: (!) 106  Temp: (!) 97 F (36.1 C)  SpO2: 100%     Body mass index is 16.41 kg/m.    ECOG FS:1 - Symptomatic but completely ambulatory  Physical Exam Constitutional:      Appearance: Normal appearance.  HENT:     Head: Normocephalic and atraumatic.  Eyes:     Pupils: Pupils are equal, round, and reactive to light.  Cardiovascular:     Rate and Rhythm: Normal rate and regular rhythm.     Heart sounds: Normal heart sounds. No murmur heard. Pulmonary:     Effort: Pulmonary effort is normal.     Breath sounds: Decreased air movement present. Decreased breath sounds present. No wheezing.     Comments: On oxygen 2 L-24/7 Abdominal:     General: Bowel sounds are normal. There is no distension.     Palpations: Abdomen is soft.     Tenderness: There is no abdominal tenderness.  Musculoskeletal:        General: Normal range of motion.     Cervical back: Normal range of motion.  Skin:    General: Skin is warm and dry.     Findings: No rash.  Neurological:     Mental Status: He is alert and oriented to person, place, and time.  Psychiatric:        Judgment: Judgment normal.    LAB RESULTS:  Lab Results  Component Value Date   NA 131 (L) 10/19/2021   K 5.1 10/19/2021   CL 91 (L) 10/19/2021   CO2 28 10/19/2021   GLUCOSE 114 (H)  10/19/2021   BUN 15 10/19/2021   CREATININE 0.58 (L) 10/19/2021   CALCIUM 9.1 10/19/2021   PROT 6.2 (L) 10/19/2021   ALBUMIN 2.5 (L) 10/19/2021   AST 46 (H) 10/19/2021   ALT 62 (H) 10/19/2021   ALKPHOS 95 10/19/2021   BILITOT 1.0 10/19/2021   GFRNONAA >60 10/19/2021    Lab Results  Component Value Date   WBC 3.8 (L) 10/19/2021   NEUTROABS 3.3 10/19/2021   HGB 11.3 (L) 10/19/2021   HCT 34.7 (L) 10/19/2021   MCV 84.2 10/19/2021   PLT 213 10/19/2021     STUDIES: NM PET Image Initial (PI) Skull Base To Thigh  Result Date: 10/03/2021 CLINICAL DATA:  Initial treatment strategy for right lung adenocarcinoma. EXAM: NUCLEAR MEDICINE PET SKULL BASE TO THIGH TECHNIQUE: 5.94 mCi F-18 FDG was injected intravenously. Full-ring PET imaging was performed from the skull base to thigh after the radiotracer. CT data was obtained and used for attenuation correction and anatomic localization. Fasting blood glucose: 94 mg/dl COMPARISON:  CTs of the chest, abdomen and pelvis 08/31/2021 and 09/01/2021. FINDINGS: Mediastinal blood pool activity: SUV max 1.5 NECK: There are hypermetabolic right supraclavicular lymph nodes, measuring up to 1.2 cm on image 70/3 (SUV max 10.7. No hypermetabolic left cervical lymph nodes.There are no lesions of the pharyngeal mucosal space. Incidental CT findings: Extensive bilateral carotid atherosclerosis. CHEST: There are multiple enlarged and hypermetabolic mediastinal and hilar lymph nodes bilaterally. A large subcarinal nodal mass extends into the AP window and demonstrates an SUV max of 27.1. There is a small hypermetabolic right axillary lymph nodes. The irregular nodular pleural, fissural and septal thickening throughout the right hemithorax on recent CT demonstrates extensive hypermetabolic activity. For example, medially at the right apex, there is an SUV max of 18.9. There is also some hypermetabolic activity within the left pleural space, most notably posterior to the  midthoracic aorta (SUV max 6.2). There is a hypermetabolic perifissural nodule along the left major fissure (SUV max 2.2) left apical density measuring approximately 3.3 x 1.6 cm on image 77/3 is also mildly hypermetabolic (4.0). Based on morphology, this is nonspecific and potentially postinflammatory. Incidental CT findings: Underlying severe centrilobular and paraseptal emphysema. Small left greater than right pleural effusions. Diffuse atherosclerosis of the aorta, great vessels and coronary arteries. Left IJ Port-A-Cath extends to the superior cavoatrial junction. ABDOMEN/PELVIS: No definite hypermetabolic activity within the liver, adrenal glands, spleen or pancreas. However, there are several hypermetabolic lymph nodes within the gastrohepatic ligament and upper retroperitoneum. For example, and the right retrocrural space, there is hypermetabolic activity with an SUV max of 8.8. No hypermetabolic nodes are identified in the pelvis. No bowel lesions identified. Incidental CT findings: Aortic and branch vessel atherosclerosis. Scattered diverticular changes throughout the colon. No ascites or peritoneal nodularity. SKELETON: Scattered small hypermetabolic osseous metastases are noted throughout the ribs, spine and bony pelvis. For example, there is a lesion in the right aspect of the L3 vertebral body which has an SUV max of 11.9. No evidence of pathologic fracture. Incidental CT findings: Thoracolumbar spondylosis, most advanced at L4-5. IMPRESSION: 1. 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 2. In addition, there is a small contralateral left pleural effusion with associated mild hypermetabolic activity, suspicious for contralateral metastatic disease. 3. Multiple nodal metastases within the mediastinum, bilateral hilar regions, right supraclavicular, right  axillary and upper abdominal stations. 4. No solid visceral organ metastases identified in the abdomen or pelvis. 5. Scattered small osseous metastases. Electronically Signed   By: Richardean Sale M.D.   On: 10/03/2021 16:58   [place] CORE BIOPSY (LYMPH NODES)  Result Date: 10/10/2021 INDICATION: Lung cancer, right supraclavicular adenopathy EXAM: Ultrasound-guided core needle biopsy of right supraclavicular lymph node MEDICATIONS: None. ANESTHESIA/SEDATION: Local analgesia FLUOROSCOPY TIME:  N/a COMPLICATIONS: None immediate. PROCEDURE: Informed written consent was obtained from the patient after a thorough discussion of the procedural risks, benefits and alternatives. All questions were addressed. Maximal Sterile Barrier Technique was utilized including caps, mask, sterile gowns, sterile gloves, sterile drape, hand hygiene and skin antiseptic. A timeout was performed prior  to the initiation of the procedure. The patient was placed supine on the exam table. Limited ultrasound of the right lower neck and supraclavicular region was performed, demonstrating abnormal soft tissue compatible with pathologic/conglomerate lymphadenopathy as described on recent PET-CT. Skin entry site was marked, and local analgesia was obtained with 1% lidocaine. Using ultrasound guidance, core needle biopsy was performed of the abnormal soft tissue in the right supraclavicular region using an 18 gauge core biopsy device x4 passes. Adequate tissue sampling was slightly challenging due to the heterogeneous nature of the tissue in question. All specimens were submitted in formalin to pathology for further handling. Limited postprocedure imaging demonstrated no hematoma or complicating feature. A clean dressing was placed after manual hemostasis. The patient tolerated the procedure well without immediate complication. IMPRESSION: Successful ultrasound-guided core needle biopsy of right supraclavicular lymphadenopathy. Electronically Signed    By: Albin Felling M.D.   On: 10/10/2021 15:57   IR IMAGING GUIDED PORT INSERTION  Result Date: 09/26/2021 INDICATION: port placement lung cancer EXAM: Ultrasound-guided puncture of the left internal jugular vein Placement of a left-sided chest port using fluoroscopic guidance MEDICATIONS: None ANESTHESIA/SEDATION: Moderate (conscious) sedation was employed during this procedure. A total of Versed 0.5 mg and Fentanyl 50 mcg was administered intravenously. Moderate Sedation Time: 26 minutes. The patient's level of consciousness and vital signs were monitored continuously by radiology nursing throughout the procedure under my direct supervision. FLUOROSCOPY TIME:  Fluoroscopy Time: 1 minute with 3 exposures COMPLICATIONS: None immediate. PROCEDURE: Informed written consent was obtained from the patient after a thorough discussion of the procedural risks, benefits and alternatives. All questions were addressed. Maximal Sterile Barrier Technique was utilized including caps, mask, sterile gowns, sterile gloves, sterile drape, hand hygiene and skin antiseptic. A timeout was performed prior to the initiation of the procedure. The patient was placed supine on the exam table. The left neck and chest was prepped and draped in the standard sterile fashion. A preliminary ultrasound of the left neck was performed and demonstrates a patent left internal jugular vein. A permanent ultrasound image was stored in the electronic medical record. The overlying skin was anesthetized with 1% Lidocaine. Using ultrasound guidance, access was obtained into the left internal jugular vein using a 21 gauge micropuncture set. A wire was advanced into the SVC, a short incision was made at the puncture site, and serial dilatation performed. Next, in an ipsilateral infraclavicular location, an incision was made at the site of the subcutaneous reservoir. Blunt dissection was used to open a pocket to contain the reservoir. A subcutaneous tunnel  was then created from the port site to the puncture site. A(n) 8 Fr single lumen catheter was advanced through the tunnel. The catheter was attached to the port and this was placed in the subcutaneous pocket. Under fluoroscopic guidance, a peel away sheath was placed, and the catheter was trimmed to the appropriate length and was advanced into the central veins. The catheter length is 32 cm. The tip of the catheter lies near the superior cavoatrial junction. The port flushes and aspirates appropriately. The port was flushed and locked with heparinized saline. The port pocket was closed in 2 layers using 3-0 and 4-0 Vicryl/absorbable suture. Dermabond was also applied to both incisions. The patient tolerated the procedure well and was transferred to recovery in stable condition. IMPRESSION: Successful placement of a left chest port using the left internal jugular vein. The port is ready for immediate use. Electronically Signed   By: Scherrie Bateman.D.  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  PLAN:    Clinically patient is stable.  Tolerated cycle 1 carbo Taxol plus Avastin fairly well.  Reviewed results of lab work with patient.  Sodium level is 131 which is lower than usual.  I have asked him to increase his fluids and salt over the next 2 weeks and return to clinic as planned.  Otherwise lab work looks pretty good.  He has stable transaminitis and anemia secondary to chemotherapy.  Mild neutropenia also due to chemotherapy.  Recommend neutropenic precautions over the holiday.  Shortness of breath-on continuous oxygen.  Stable.  Cough-chronic and stable.  Weight loss-stable.  Disposition- Return to clinic as scheduled for next cycle of chemotherapy.  No additional IV fluids needed today.  I spent 25 minutes dedicated to the care of this patient (face-to-face and non-face-to-face) on the date of the encounter to  include what is described in the assessment and plan.   Patient expressed understanding and was in agreement with this plan. He also understands that He can call clinic at any time with any questions, concerns, or complaints.    Cancer Staging  Adenocarcinoma of right lung [facility]) Staging form: Lung, AJCC 8th Edition - Clinical: Stage IVA (cT2, cN2, cM1a) - Signed by Lloyd Huger, MD on 09/18/2021  Jacquelin Hawking, NP   10/19/2021 11:22 AM

## 2021-10-20 ENCOUNTER — Encounter: Payer: Self-pay | Admitting: Nurse Practitioner

## 2021-10-20 ENCOUNTER — Other Ambulatory Visit: Payer: Self-pay | Admitting: Nurse Practitioner

## 2021-10-20 DIAGNOSIS — R0602 Shortness of breath: Secondary | ICD-10-CM

## 2021-10-20 DIAGNOSIS — R5381 Other malaise: Secondary | ICD-10-CM

## 2021-10-20 DIAGNOSIS — C3491 Malignant neoplasm of unspecified part of right bronchus or lung: Secondary | ICD-10-CM

## 2021-10-20 DIAGNOSIS — Z515 Encounter for palliative care: Secondary | ICD-10-CM

## 2021-10-20 LAB — BETA HCG QUANT (REF LAB): hCG Quant: 6 m[IU]/mL — ABNORMAL HIGH (ref 0–3)

## 2021-10-20 LAB — AFP TUMOR MARKER: AFP, Serum, Tumor Marker: 6.4 ng/mL (ref 0.0–8.4)

## 2021-10-20 LAB — CEA: CEA: 8.6 ng/mL — ABNORMAL HIGH (ref 0.0–4.7)

## 2021-10-20 LAB — CANCER ANTIGEN 19-9: CA 19-9: 91 U/mL — ABNORMAL HIGH (ref 0–35)

## 2021-10-20 NOTE — Progress Notes (Signed)
[facility] Pall[facility] Consult Note Telephone: [phone]  Fax: [phone]   Date of encounter: 10/20/21 11:29 AM PATIENT NAME: Cody Williams [address] [place] [place] [address]   [id number] (home) [phone] (work) DOB: Mar 18, 1941 MRN: [id number] PRIMARY CARE PROVIDER:    Baxter Hire, MD,  [place] [address] [phone]  REFERRING PROVIDER:   Dr Grayland Ormond  RESPONSIBLE PARTY:    Contact Information     Name Relation Home Work Mobile   Leontine Locket Sister [phone]        I met face to face with patient and family in home. Palliative Care was asked to follow this patient by consultation request of  Dr Grayland Ormond to address advance care planning and complex medical decision making. This is the initial visit.                            ASSESSMENT AND PLAN / RECOMMENDATIONS:  Advance Care Planning/Goals of Care: Goals include to maximize quality of life and symptom management. Patient/health care surrogate gave his/her permission to discuss.Our advance care planning conversation included a discussion about:    The value and importance of advance care planning  Experiences with loved ones who have been seriously ill or have died  Exploration of personal, cultural or spiritual beliefs that might influence medical decisions  Exploration of goals of care in the event of a sudden injury or illness  Identification and preparation of a healthcare agent  Review and updating or creation of an  advance directive document . Decision not to resuscitate or to de-escalate disease focused treatments due to poor prognosis. CODE STATUS: DNR  Symptom Management/Plan: 1. Advance Care Planning; DNR, goldenrod form completed; reviewed MOST form  2. Goals of Care: Goals include to maximize quality of life and symptom management. Our advance care planning conversation included a discussion about:    The value and  importance of advance care planning  Exploration of personal, cultural or spiritual beliefs that might influence medical decisions  Exploration of goals of care in the event of a sudden injury or illness  Identification and preparation of a healthcare agent  Review and updating or creation of an advance directive document.  3. 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 Continue to monitor respiratory status, continuous O2, inhalation therapy, continue encourage to rest, endurance.   4. 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 Encourage energy conservation, fall risk  5. Palliative care encounter; Palliative care encounter; Palliative medicine team will continue to support patient, patient's family, and medical team. Visit consisted of counseling and education dealing with the complex and emotionally intense issues of symptom management and palliative care in the setting of serious and potentially life-threatening illness  6. f/u 1 month for ongoing monitoring chronic disease progression, ongoing discussions complex medical decision making  Follow up Palliative Care Visit: Palliative care will continue to follow for complex medical decision making, advance care planning, and clarification of goals. Return 4 weeks with PC RN, then 8 weeks PC NP or prn.  I spent 67 minutes providing this consultation. More than 50% of the time in this consultation was spent in counseling and care coordination  PPS: 40%  Chief Complaint: Initial palliative consult for complex medical decision making  HISTORY OF PRESENT ILLNESS:  Cody Williams  Cody Williams is a 80 y.o. 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 I called Ms. Dark, Mr. Amadon to confirm pc initial visit  and  covid negative screening. I visited Mr Ogborn and Ms Maryjo Rochester in Mr Medley home. Mr Grunewald resides with his brother who is a HD patient. Ms. Maryjo Rochester comes and helps them with their needs. We talked about past medical history, life review, chronic disease progression, dx ca with chemotherapy treatment, symptoms, appetite which has slightly improved with dexamethasone, 1 lb weight gain. We talked about functional ability to ambulate, No recent falls. Talked about fall risk. We talked about meals which with option of meals on wheels, resources. Mr. Venable endorses he is okay for now. Mr. Mazor endorses his brother receives meals on wheels. Mr Trivedi endorses he is able to do his bath, get dressed, it does take him a long time but he is not in any hurry. We talked about medical goals. We talked about MOST form completed with wishes with DNR. Golden rod form also completed. We talked about 2 siblings passed away this past year.  We talked about f/u PC visit, scheduled. Therapeutic listening, emotional support provided, Questions answered  History obtained from review of EMR, discussion with Sister Ms. Dark and Mr. Nazir.  I reviewed available labs, medications, imaging, studies and related documents from the EMR.  Records reviewed and summarized above.   ROS Reviewed 10 point system all negative except HPI  Physical Exam: Constitutional: NAD General: frail appearing, thin, chronically ill pleasant male EYES: lids intact ENMT: oral mucous membranes moist CV: S1S2, RRR, BLE edema Pulmonary: decrease right base with fine crackles, no increased work of breathing, O2 Abdomen: normo-active BS + 4 quadrants, soft and non tender MSK:  ambulatory Skin: warm and dry Neuro:  + generalized weakness,  no cognitive impairment Psych: non-anxious affect, A and O x 3  CURRENT PROBLEM LIST:  Patient Active Problem List   Diagnosis Date Noted   Adenocarcinoma of right lung ([place]) 09/18/2021   Chronic respiratory failure  with hypoxia ([place]) 09/14/2021   Cigarette smoker 09/14/2021   COPD GOLD ?  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 01/31/2021   Hypertension 07/02/2017   Hyperlipidemia 07/02/2017   Carotid stenosis 07/02/2017   Current tobacco use 06/23/2014   PAST MEDICAL HISTORY:  Active Ambulatory Problems    Diagnosis Date Noted   Hypertension 07/02/2017   Hyperlipidemia 07/02/2017   Carotid stenosis 07/02/2017   Current tobacco use 06/23/2014   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  09/14/2021   Adenocarcinoma of right lung ([place]) 09/18/2021   Resolved Ambulatory Problems    Diagnosis Date Noted   No Resolved Ambulatory Problems   Past Medical History:  Diagnosis Date   Glaucoma    Lung  cancer [facility])    SOCIAL HX:  Social History   Tobacco Use   Smoking status: Former    Types: Cigarettes    Quit date: 04/15/2018    Years since quitting: 3.5   Smokeless tobacco: Never  Substance Use Topics   Alcohol use: Yes   FAMILY HX:  Family History  Problem Relation Age of Onset   Varicose Veins Neg Hx    Vision loss Neg Hx     reviewed  ALLERGIES: No Known Allergies   PERTINENT MEDICATIONS:  Outpatient Encounter Medications as of 10/20/2021  Medication Sig   albuterol (VENTOLIN HFA) 108 (90 Base) MCG/ACT inhaler Inhale 2 puffs into the lungs  every 6 (six) hours as needed for wheezing or shortness of breath.   amLODipine (NORVASC) 5 MG tablet take 1 tablet by mouth once daily   aspirin EC 81 MG tablet Take 81 mg by mouth daily.   atorvastatin (LIPITOR) 10 MG tablet take 1 tablet by mouth once daily   dexamethasone (DECADRON) 4 MG tablet Take 1 tablet (4 mg total) by mouth daily.   dorzolamide-timolol (COSOPT) 22.3-6.8 MG/ML ophthalmic solution 1 drop 2 (two) times daily.   lidocaine-prilocaine (EMLA) cream Apply to affected area once   mometasone-formoterol (DULERA) 200-5 MCG/ACT AERO Inhale 2 puffs into the lungs 2 (two) times daily.   nicotine (NICODERM CQ - DOSED IN MG/24 HOURS) 21 mg/24hr patch Place 1 patch (21 mg total) onto the skin daily as needed (nicotine craving). (Patient not taking: Reported on 10/19/2021)   ondansetron (ZOFRAN) 8 MG tablet Take 1 tablet (8 mg total) by mouth 2 (two) times daily as needed for refractory nausea / vomiting.   prochlorperazine (COMPAZINE) 10 MG tablet Take 1 tablet (10 mg total) by mouth every 6 (six) hours as needed (Nausea or vomiting).   tiotropium (SPIRIVA) 18 MCG inhalation capsule Place 1 capsule (18 mcg total) into inhaler and inhale daily.   WIXELA INHUB 500-50 MCG/ACT AEPB    No facility-administered encounter medications on file as of 10/20/2021.   Thank you for the opportunity to participate in the care of Mr. Harriott.  The palliative care team will continue to follow. Please call our office at [phone] if we can be of additional assistance.   This chart was dictated using voice recognition software.  Despite best efforts to proofread,  errors can occur which can change the documentation meaning.   Questions and concerns were addressed. The patient/family was encouraged to call with questions and/or concerns. My business card was provided. Provided general support and encouragement, no other unmet needs identified   Joshuah Minella Z Artrell Lawless, NP ,   COVID-19 PATIENT SCREENING  TOOL Asked and negative response unless otherwise noted:  Have you had symptoms of covid, tested positive or been in contact with someone with symptoms/positive test in the past 5-10 days? NO

## 2021-10-26 NOTE — Progress Notes (Deleted)
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  INTERVAL HISTORY: Patient returns to clinic today for further evaluation and to initiate cycle 1 of carboplatinum, Taxol, and Avastin.  Patient continues to have weakness and fatigue, but states this is improved.  He reports his appetite has improved as well.  He has shortness of breath requiring oxygen 24 hours/day. He has no neurologic complaints.  He denies any fevers.  He has no chest pain, cough, or hemoptysis.  He denies any nausea, vomiting, constipation, or diarrhea.  He has no urinary complaints.  Patient offers no further specific complaints today.    REVIEW OF SYSTEMS:   Review of Systems  Constitutional:  Positive for malaise/fatigue. Negative for fever and weight loss.  Respiratory:  Positive for shortness of breath. Negative for cough and hemoptysis.   Cardiovascular: Negative.  Negative for chest pain and leg swelling.  Gastrointestinal: Negative.  Negative for abdominal pain.  Genitourinary: Negative.  Negative for dysuria.  Musculoskeletal: Negative.  Negative for back pain.  Skin: Negative.  Negative for rash.  Neurological:  Positive for weakness. Negative for dizziness, focal weakness and headaches.  Psychiatric/Behavioral: Negative.  The patient is not nervous/anxious.    As per HPI. Otherwise, a complete review of systems is negative.  PAST MEDICAL HISTORY: Past Medical History:  Diagnosis Date   Glaucoma    Hyperlipidemia    Hypertension    Lung cancer ([place])     PAST SURGICAL HISTORY: Past Surgical History:  Procedure Laterality Date   EYE SURGERY     IR IMAGING GUIDED PORT INSERTION   09/26/2021    FAMILY HISTORY: Family History  Problem Relation Age of Onset   Varicose Veins Neg Hx    Vision loss Neg Hx     ADVANCED DIRECTIVES (Y/N):  N  HEALTH MAINTENANCE: Social History   Tobacco Use   Smoking status: Former    Types: Cigarettes    Quit date: 04/15/2018    Years since quitting: 3.5   Smokeless tobacco: Never  Vaping Use   Vaping Use: Never used  Substance Use Topics   Alcohol use: Yes   Drug use: No     Colonoscopy:  PAP:  Bone density:  Lipid panel:  No Known Allergies  Current Outpatient Medications  Medication Sig Dispense Refill   albuterol (VENTOLIN HFA) 108 (90 Base) MCG/ACT inhaler Inhale 2 puffs into the lungs every 6 (six) hours as needed for wheezing or shortness of breath. 8 g 2   amLODipine (NORVASC) 5 MG tablet take 1 tablet by mouth once daily     aspirin EC 81 MG tablet Take 81 mg by mouth daily.     atorvastatin (LIPITOR) 10 MG tablet take 1 tablet by mouth once daily     dexamethasone (DECADRON) 4 MG tablet Take 1 tablet (4 mg total) by mouth daily. 30 tablet 3   dorzolamide-timolol (COSOPT) 22.3-6.8 MG/ML ophthalmic solution 1 drop 2 (two) times daily.     lidocaine-prilocaine (EMLA) cream Apply to affected area once 30 g 3   mometasone-formoterol (DULERA) 200-5 MCG/ACT AERO Inhale 2 puffs into the lungs 2 (two) times daily. 13 g 1   nicotine (NICODERM CQ - DOSED IN MG/24  HOURS) 21 mg/24hr patch Place 1 patch (21 mg total) onto the skin daily as needed (nicotine craving). (Patient not taking: Reported on 10/19/2021) 28 patch 0   ondansetron (ZOFRAN) 8 MG tablet Take 1 tablet (8 mg total) by mouth 2 (two) times daily as needed for refractory nausea / vomiting. 60 tablet 1   prochlorperazine (COMPAZINE) 10 MG tablet Take 1 tablet (10 mg total) by mouth every 6 (six) hours as needed (Nausea or vomiting). 60 tablet 1   tiotropium (SPIRIVA) 18 MCG inhalation capsule Place 1 capsule (18 mcg total) into inhaler and inhale daily. 30  capsule 1   WIXELA INHUB 500-50 MCG/ACT AEPB      No current facility-administered medications for this visit.    OBJECTIVE: There were no vitals filed for this visit.    There is no height or weight on file to calculate BMI.    ECOG FS:1 - Symptomatic but completely ambulatory  General: Well-developed, well-nourished, no acute distress.  Sitting in a wheelchair. Eyes: Pink conjunctiva, anicteric sclera. HEENT: Normocephalic, moist mucous membranes. Lungs: No audible wheezing or coughing. Heart: Regular rate and rhythm. Abdomen: Soft, nontender, no obvious distention. Musculoskeletal: No edema, cyanosis, or clubbing. Neuro: Alert, answering all questions appropriately. Cranial nerves grossly intact. Skin: No rashes or petechiae noted. Psych: Normal affect.  LAB RESULTS:  Lab Results  Component Value Date   NA 131 (L) 10/19/2021   K 5.1 10/19/2021   CL 91 (L) 10/19/2021   CO2 28 10/19/2021   GLUCOSE 114 (H) 10/19/2021   BUN 15 10/19/2021   CREATININE 0.58 (L) 10/19/2021   CALCIUM 9.1 10/19/2021   PROT 6.2 (L) 10/19/2021   ALBUMIN 2.5 (L) 10/19/2021   AST 46 (H) 10/19/2021   ALT 62 (H) 10/19/2021   ALKPHOS 95 10/19/2021   BILITOT 1.0 10/19/2021   GFRNONAA >60 10/19/2021    Lab Results  Component Value Date   WBC 3.8 (L) 10/19/2021   NEUTROABS 3.3 10/19/2021   HGB 11.3 (L) 10/19/2021   HCT 34.7 (L) 10/19/2021   MCV 84.2 10/19/2021   PLT 213 10/19/2021     STUDIES: NM PET Image Initial (PI) Skull Base To Thigh  Result Date: 10/03/2021 CLINICAL DATA:  Initial treatment strategy for right lung adenocarcinoma. EXAM: NUCLEAR MEDICINE PET SKULL BASE TO THIGH TECHNIQUE: 5.94 mCi F-18 FDG was injected intravenously. Full-ring PET imaging was performed from the skull base to thigh after the radiotracer. CT data was obtained and used for attenuation correction and anatomic localization. Fasting blood glucose: 94 mg/dl COMPARISON:  CTs of the chest, abdomen and pelvis  08/31/2021 and 09/01/2021. FINDINGS: Mediastinal blood pool activity: SUV max 1.5 NECK: There are hypermetabolic right supraclavicular lymph nodes, measuring up to 1.2 cm on image 70/3 (SUV max 10.7. No hypermetabolic left cervical lymph nodes.There are no lesions of the pharyngeal mucosal space. Incidental CT findings: Extensive bilateral carotid atherosclerosis. CHEST: There are multiple enlarged and hypermetabolic mediastinal and hilar lymph nodes bilaterally. A large subcarinal nodal mass extends into the AP window and demonstrates an SUV max of 27.1. There is a small hypermetabolic right axillary lymph nodes. The irregular nodular pleural, fissural and septal thickening throughout the right hemithorax on recent CT demonstrates extensive hypermetabolic activity. For example, medially at the right apex, there is an SUV max of 18.9. There is also some hypermetabolic activity within the left pleural space, most notably posterior to the midthoracic aorta (SUV max 6.2). There is a hypermetabolic perifissural nodule along the left major fissure (  SUV max 2.2) left apical density measuring approximately 3.3 x 1.6 cm on image 77/3 is also mildly hypermetabolic (4.0). Based on morphology, this is nonspecific and potentially postinflammatory. Incidental CT findings: Underlying severe centrilobular and paraseptal emphysema. Small left greater than right pleural effusions. Diffuse atherosclerosis of the aorta, great vessels and coronary arteries. Left IJ Port-A-Cath extends to the superior cavoatrial junction. ABDOMEN/PELVIS: No definite hypermetabolic activity within the liver, adrenal glands, spleen or pancreas. However, there are several hypermetabolic lymph nodes within the gastrohepatic ligament and upper retroperitoneum. For example, and the right retrocrural space, there is hypermetabolic activity with an SUV max of 8.8. No hypermetabolic nodes are identified in the pelvis. No bowel lesions identified. Incidental CT  findings: Aortic and branch vessel atherosclerosis. Scattered diverticular changes throughout the colon. No ascites or peritoneal nodularity. SKELETON: Scattered small hypermetabolic osseous metastases are noted throughout the ribs, spine and bony pelvis. For example, there is a lesion in the right aspect of the L3 vertebral body which has an SUV max of 11.9. No evidence of pathologic fracture. Incidental CT findings: Thoracolumbar spondylosis, most advanced at L4-5. IMPRESSION: 1. 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 2. In addition, there is a small contralateral left pleural effusion with associated mild hypermetabolic activity, suspicious for contralateral metastatic disease. 3. Multiple nodal metastases within the mediastinum, bilateral hilar regions, right supraclavicular, right axillary and upper abdominal stations. 4. No solid visceral organ metastases identified in the abdomen or pelvis. 5. Scattered small osseous metastases. Electronically Signed   By: Richardean Sale M.D.   On: 10/03/2021 16:58   [place] CORE BIOPSY (LYMPH NODES)  Result Date: 10/10/2021 INDICATION: Lung cancer, right supraclavicular adenopathy EXAM: Ultrasound-guided core needle biopsy of right supraclavicular lymph node MEDICATIONS: None. ANESTHESIA/SEDATION: Local analgesia FLUOROSCOPY TIME:  N/a COMPLICATIONS: None immediate. PROCEDURE: Informed written consent was obtained from the patient after a thorough discussion of the procedural risks, benefits and alternatives. All questions were addressed. Maximal Sterile Barrier Technique was utilized including caps, mask, sterile gowns, sterile gloves, sterile drape, hand hygiene and skin antiseptic. A timeout was performed prior to the initiation of the procedure. The patient was placed supine on the exam table. Limited ultrasound of the  right lower neck and supraclavicular region was performed, demonstrating abnormal soft tissue compatible with pathologic/conglomerate lymphadenopathy as described on recent PET-CT. Skin entry site was marked, and local analgesia was obtained with 1% lidocaine. Using ultrasound guidance, core needle biopsy was performed of the abnormal soft tissue in the right supraclavicular region using an 18 gauge core biopsy device x4 passes. Adequate tissue sampling was slightly challenging due to the heterogeneous nature of the tissue in question. All specimens were submitted in formalin to pathology for further handling. Limited postprocedure imaging demonstrated no hematoma or complicating feature. A clean dressing was placed after manual hemostasis. The patient tolerated the procedure well without immediate complication. IMPRESSION: Successful ultrasound-guided core needle biopsy of right supraclavicular lymphadenopathy. Electronically Signed   By: Albin Felling M.D.   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  Patient also noted to have right hilar, mediastinal,  and right neck lymphadenopathy.  PET scan results from October 02, 2021 reviewed independently and report as above confirming stage and anatomic spread of disease.   Thoracentesis on August 31, 2021 confirmed adenocarcinoma, likely of lung origin.  Unfortunately there was not enough tissue or additional testing.  MRI of the brain on September 01, 2021 did not reveal any metastatic disease.  Despite patient's decreased performance status, he wishes to attempt systemic chemotherapy with carboplatinum, Taxol, and Avastin.  He has also agreed to  ultrasound-guided biopsy of right supraclavicular lymph node in an effort to obtain more tissue for ancillary testing.  Patient has had port placement.  Proceed with cycle 1 of dose reduced carboplatinum, Taxol, and Avastin today.  Return to clinic in 1 week for laboratory work and further evaluation and then in 3 weeks for laboratory work, further evaluation, and consideration of cycle 2.   Shortness of breath: Chronic and unchanged.  Continue oxygen as prescribed.   Poor appetite: Appreciate dietary input.  Continue dexamethasone 4 mg daily. Leukocytosis: Likely secondary to steroid use.  Monitor.  Patient does not require Neulasta at this time, but would consider in the future if patient became neutropenic. Transaminitis: Mild, monitor.   Patient expressed understanding and was in agreement with this plan. He also understands that He can call clinic at any time with any questions, concerns, or complaints.    Cancer Staging  Adenocarcinoma of right lung [facility]) Staging form: Lung, AJCC 8th Edition - Clinical: Stage IVA (cT2, cN2, cM1a) - Signed by Lloyd Huger, MD on 09/18/2021  Lloyd Huger, MD   10/26/2021 2:10 PM

## 2021-10-31 ENCOUNTER — Other Ambulatory Visit: Payer: Self-pay

## 2021-10-31 ENCOUNTER — Emergency Department: Payer: Medicare HMO

## 2021-10-31 ENCOUNTER — Inpatient Hospital Stay
Admission: EM | Admit: 2021-10-31 | Discharge: 2021-11-29 | DRG: 951 | Disposition: E | Payer: Medicare HMO | Attending: Internal Medicine | Admitting: Internal Medicine

## 2021-10-31 ENCOUNTER — Telehealth: Payer: Self-pay | Admitting: *Deleted

## 2021-10-31 DIAGNOSIS — J9601 Acute respiratory failure with hypoxia: Secondary | ICD-10-CM

## 2021-10-31 DIAGNOSIS — R079 Chest pain, unspecified: Secondary | ICD-10-CM

## 2021-10-31 DIAGNOSIS — E875 Hyperkalemia: Secondary | ICD-10-CM | POA: Diagnosis not present

## 2021-10-31 DIAGNOSIS — J189 Pneumonia, unspecified organism: Secondary | ICD-10-CM | POA: Diagnosis not present

## 2021-10-31 DIAGNOSIS — C3411 Malignant neoplasm of upper lobe, right bronchus or lung: Secondary | ICD-10-CM | POA: Diagnosis present

## 2021-10-31 DIAGNOSIS — R64 Cachexia: Secondary | ICD-10-CM | POA: Diagnosis present

## 2021-10-31 DIAGNOSIS — J910 Malignant pleural effusion: Secondary | ICD-10-CM | POA: Diagnosis present

## 2021-10-31 DIAGNOSIS — J90 Pleural effusion, not elsewhere classified: Secondary | ICD-10-CM | POA: Diagnosis not present

## 2021-10-31 DIAGNOSIS — E11649 Type 2 diabetes mellitus with hypoglycemia without coma: Secondary | ICD-10-CM | POA: Diagnosis not present

## 2021-10-31 DIAGNOSIS — I10 Essential (primary) hypertension: Secondary | ICD-10-CM | POA: Diagnosis present

## 2021-10-31 DIAGNOSIS — E872 Acidosis, unspecified: Secondary | ICD-10-CM | POA: Diagnosis present

## 2021-10-31 DIAGNOSIS — Z7982 Long term (current) use of aspirin: Secondary | ICD-10-CM

## 2021-10-31 DIAGNOSIS — Z66 Do not resuscitate: Secondary | ICD-10-CM | POA: Diagnosis present

## 2021-10-31 DIAGNOSIS — R778 Other specified abnormalities of plasma proteins: Secondary | ICD-10-CM | POA: Diagnosis present

## 2021-10-31 DIAGNOSIS — E43 Unspecified severe protein-calorie malnutrition: Secondary | ICD-10-CM | POA: Diagnosis not present

## 2021-10-31 DIAGNOSIS — L89312 Pressure ulcer of right buttock, stage 2: Secondary | ICD-10-CM | POA: Diagnosis present

## 2021-10-31 DIAGNOSIS — R0602 Shortness of breath: Secondary | ICD-10-CM | POA: Diagnosis not present

## 2021-10-31 DIAGNOSIS — J9621 Acute and chronic respiratory failure with hypoxia: Secondary | ICD-10-CM | POA: Diagnosis not present

## 2021-10-31 DIAGNOSIS — R54 Age-related physical debility: Secondary | ICD-10-CM | POA: Diagnosis present

## 2021-10-31 DIAGNOSIS — E785 Hyperlipidemia, unspecified: Secondary | ICD-10-CM | POA: Diagnosis present

## 2021-10-31 DIAGNOSIS — A419 Sepsis, unspecified organism: Secondary | ICD-10-CM | POA: Diagnosis present

## 2021-10-31 DIAGNOSIS — Z20822 Contact with and (suspected) exposure to covid-19: Secondary | ICD-10-CM | POA: Diagnosis not present

## 2021-10-31 DIAGNOSIS — N179 Acute kidney failure, unspecified: Secondary | ICD-10-CM | POA: Diagnosis not present

## 2021-10-31 DIAGNOSIS — Z87891 Personal history of nicotine dependence: Secondary | ICD-10-CM

## 2021-10-31 DIAGNOSIS — R1084 Generalized abdominal pain: Secondary | ICD-10-CM | POA: Diagnosis present

## 2021-10-31 DIAGNOSIS — R6521 Severe sepsis with septic shock: Secondary | ICD-10-CM | POA: Diagnosis not present

## 2021-10-31 DIAGNOSIS — E162 Hypoglycemia, unspecified: Secondary | ICD-10-CM | POA: Diagnosis present

## 2021-10-31 DIAGNOSIS — F17210 Nicotine dependence, cigarettes, uncomplicated: Secondary | ICD-10-CM | POA: Diagnosis not present

## 2021-10-31 DIAGNOSIS — H409 Unspecified glaucoma: Secondary | ICD-10-CM | POA: Diagnosis present

## 2021-10-31 DIAGNOSIS — Z681 Body mass index (BMI) 19 or less, adult: Secondary | ICD-10-CM | POA: Diagnosis not present

## 2021-10-31 DIAGNOSIS — Y95 Nosocomial condition: Secondary | ICD-10-CM | POA: Diagnosis present

## 2021-10-31 DIAGNOSIS — I6529 Occlusion and stenosis of unspecified carotid artery: Secondary | ICD-10-CM | POA: Diagnosis not present

## 2021-10-31 DIAGNOSIS — Z515 Encounter for palliative care: Secondary | ICD-10-CM | POA: Diagnosis not present

## 2021-10-31 DIAGNOSIS — E871 Hypo-osmolality and hyponatremia: Secondary | ICD-10-CM | POA: Diagnosis present

## 2021-10-31 DIAGNOSIS — Z79899 Other long term (current) drug therapy: Secondary | ICD-10-CM

## 2021-10-31 DIAGNOSIS — R109 Unspecified abdominal pain: Secondary | ICD-10-CM | POA: Diagnosis not present

## 2021-10-31 DIAGNOSIS — Z7951 Long term (current) use of inhaled steroids: Secondary | ICD-10-CM

## 2021-10-31 DIAGNOSIS — C3491 Malignant neoplasm of unspecified part of right bronchus or lung: Secondary | ICD-10-CM | POA: Diagnosis not present

## 2021-10-31 DIAGNOSIS — Z7952 Long term (current) use of systemic steroids: Secondary | ICD-10-CM

## 2021-10-31 DIAGNOSIS — L8990 Pressure ulcer of unspecified site, unspecified stage: Secondary | ICD-10-CM | POA: Insufficient documentation

## 2021-10-31 LAB — BASIC METABOLIC PANEL
Anion gap: 21 — ABNORMAL HIGH (ref 5–15)
BUN: 36 mg/dL — ABNORMAL HIGH (ref 8–23)
CO2: 16 mmol/L — ABNORMAL LOW (ref 22–32)
Calcium: 9 mg/dL (ref 8.9–10.3)
Chloride: 94 mmol/L — ABNORMAL LOW (ref 98–111)
Creatinine, Ser: 2.08 mg/dL — ABNORMAL HIGH (ref 0.61–1.24)
GFR, Estimated: 32 mL/min — ABNORMAL LOW (ref >60)
Glucose, Bld: 39 mg/dL — CL (ref 70–99)
Potassium: 6.6 mmol/L (ref 3.5–5.1)
Sodium: 131 mmol/L — ABNORMAL LOW (ref 135–145)

## 2021-10-31 LAB — CBC
HCT: 39.9 % (ref 39.0–52.0)
Hemoglobin: 12.6 g/dL — ABNORMAL LOW (ref 13.0–17.0)
MCH: 26.9 pg (ref 26.0–34.0)
MCHC: 31.6 g/dL (ref 30.0–36.0)
MCV: 85.3 fL (ref 80.0–100.0)
Platelets: 475 10*3/uL — ABNORMAL HIGH (ref 150–400)
RBC: 4.68 MIL/uL (ref 4.22–5.81)
RDW: 20.2 % — ABNORMAL HIGH (ref 11.5–15.5)
WBC: 12.6 10*3/uL — ABNORMAL HIGH (ref 4.0–10.5)
nRBC: 0 % (ref 0.0–0.2)

## 2021-10-31 LAB — TROPONIN I (HIGH SENSITIVITY): Troponin I (High Sensitivity): 41 ng/L — ABNORMAL HIGH (ref <18)

## 2021-10-31 LAB — RESP PANEL BY RT-PCR (FLU A&B, COVID) ARPGX2
Influenza A by PCR: NEGATIVE
Influenza B by PCR: NEGATIVE
SARS Coronavirus 2 by RT PCR: NEGATIVE

## 2021-10-31 LAB — LACTIC ACID, PLASMA: Lactic Acid, Venous: >9 mmol/L (ref 0.5–1.9)

## 2021-10-31 MED ORDER — SODIUM CHLORIDE 0.9 % IV BOLUS
500.0000 mL | Freq: Once | INTRAVENOUS | Status: AC
  Administered 2021-10-31: 500 mL via INTRAVENOUS

## 2021-10-31 MED ORDER — NAPHAZOLINE-GLYCERIN 0.012-0.25 % OP SOLN
1.0000 [drp] | Freq: Four times a day (QID) | OPHTHALMIC | Status: DC | PRN
  Filled 2021-10-31: qty 15

## 2021-10-31 MED ORDER — IPRATROPIUM-ALBUTEROL 0.5-2.5 (3) MG/3ML IN SOLN
3.0000 mL | Freq: Once | RESPIRATORY_TRACT | Status: AC
  Administered 2021-10-31: 3 mL via RESPIRATORY_TRACT
  Filled 2021-10-31: qty 3

## 2021-10-31 MED ORDER — LORAZEPAM 2 MG/ML PO CONC
1.0000 mg | ORAL | Status: DC | PRN
  Filled 2021-10-31: qty 0.5

## 2021-10-31 MED ORDER — HALOPERIDOL LACTATE 2 MG/ML PO CONC
0.5000 mg | ORAL | Status: DC | PRN
  Filled 2021-10-31: qty 0.3

## 2021-10-31 MED ORDER — SCOPOLAMINE 1 MG/3DAYS TD PT72
1.0000 | MEDICATED_PATCH | TRANSDERMAL | Status: DC
  Administered 2021-10-31: 1.5 mg via TRANSDERMAL
  Filled 2021-10-31: qty 1

## 2021-10-31 MED ORDER — IPRATROPIUM-ALBUTEROL 0.5-2.5 (3) MG/3ML IN SOLN
3.0000 mL | Freq: Four times a day (QID) | RESPIRATORY_TRACT | Status: DC
  Administered 2021-10-31: 3 mL via RESPIRATORY_TRACT
  Filled 2021-10-31: qty 3

## 2021-10-31 MED ORDER — LORAZEPAM 2 MG/ML IJ SOLN
1.0000 mg | INTRAMUSCULAR | Status: DC | PRN
  Administered 2021-10-31 – 2021-11-01 (×3): 1 mg via INTRAVENOUS
  Filled 2021-10-31 (×3): qty 1

## 2021-10-31 MED ORDER — LORAZEPAM 1 MG PO TABS: 1.0000 mg | ORAL_TABLET | ORAL | Status: DC | PRN

## 2021-10-31 MED ORDER — MORPHINE SULFATE (PF) 2 MG/ML IV SOLN
1.0000 mg | INTRAVENOUS | Status: DC | PRN
  Administered 2021-10-31 – 2021-11-01 (×7): 1 mg via INTRAVENOUS
  Filled 2021-10-31 (×7): qty 1

## 2021-10-31 MED ORDER — DEXTROSE 50 % IV SOLN
1.0000 | Freq: Once | INTRAVENOUS | Status: AC
  Administered 2021-10-31: 50 mL via INTRAVENOUS
  Filled 2021-10-31: qty 50

## 2021-10-31 MED ORDER — HALOPERIDOL LACTATE 5 MG/ML IJ SOLN: 0.5000 mg | INTRAMUSCULAR | Status: DC | PRN

## 2021-10-31 MED ORDER — HALOPERIDOL 0.5 MG PO TABS
0.5000 mg | ORAL_TABLET | ORAL | Status: DC | PRN
  Filled 2021-10-31: qty 1

## 2021-10-31 NOTE — H&P (Signed)
History and Physical    Cody Williams:[id number] DOB: May 13, 1941 DOA: 11/04/2021  Referring MD/NP/PA:   PCP: Baxter Hire, MD   Patient coming from:  The patient is coming from home.   Chief Complaint: Shortness of breath, cough, chest pain, abdominal pain  HPI: Cody Williams is a 81 y.o. 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  Patient is alert oriented x3.  Patient can answer all questions appropriately.  He states that he has cough and worsening shortness breath in the past several days, which has been progressively worsening.  Patient also has right-sided chest pain, which is sharp, moderate, nonradiating, pleuritic, aggravated by deep breaths.  Denies fever or chills.  He also reports abdominal pain, which is diffuse, constant, sharp, severe, nonradiating.  Patient denies nausea, vomiting or diarrhea.  No symptoms of UTI.  He moves all extremities normally.  He has very dry mucous membrane.  Patient was found to have severe respiratory distress, oxygen desaturation to 52% on 8 L oxygen in ED. Pt has Port-A in left upper chest.   ED Course: pt was found to have WBC 12.6, lactic acid > 11.0, troponin level 41, pending COVID PCR, AKI with creatinine 0.28 and BUN 36 (baseline creatinine 0.58 on 10/19/2021), hyperglycemia with blood sugar 39, hyperkalemia with potassium 6.6, hyponatremia with sodium 131.  Temperature normal, blood pressure 62/53, which improved to 108/56 after giving 500 cc normal saline, heart rate up to 146, RR 28.  Chest x-ray showed right lung infiltration.  Patient is admitted to [facility] bed for comfort care.  CXR: Overall unchanged small right pleural effusion  and pleural thickening, with right lung airspace disease.  Review of Systems:   General: no fevers, chills, no body weight gain, has poor appetite, has fatigue HEENT: no blurry vision, hearing changes or sore throat Respiratory: has dyspnea, coughing, no wheezing CV: has chest pain, no palpitations GI: no nausea, vomiting, has abdominal pain, no diarrhea, constipation GU: no dysuria, burning on urination, increased urinary frequency, hematuria  Ext: no leg edema Neuro: no unilateral weakness, numbness, or tingling, no vision change or hearing loss Skin: no rash, no skin tear. MSK: No muscle spasm, no deformity, no limitation of range of movement in spin Heme: No easy bruising.  Travel history: No recent long distant travel.  Allergy: No Known Allergies  Past Medical History:  Diagnosis Date   Glaucoma    Hyperlipidemia    Hypertension    Lung cancer ([place])     Past Surgical History:  Procedure Laterality Date   EYE SURGERY     IR IMAGING GUIDED PORT INSERTION  09/26/2021    Social History:  reports that he quit smoking about 3 years ago. His smoking use included cigarettes. He has never used smokeless tobacco. He reports current alcohol use. He reports that he does not use drugs.  Family History:  Family History  Problem Relation Age of Onset   Varicose Veins Neg Hx    Vision loss Neg Hx      Prior to Admission medications   Medication Sig Start Date End Date Taking? Authorizing Provider  albuterol (VENTOLIN HFA) 108 (90 Base) MCG/ACT inhaler Inhale 2 puffs into the lungs every 6 (six) hours as needed for  wheezing or shortness of breath. 09/02/21   Shelly Coss, MD  amLODipine (NORVASC) 5 MG tablet take 1 tablet by mouth once daily 03/05/17   [provider]  aspirin EC 81 MG tablet Take 81 mg by mouth daily.    [provider]  atorvastatin (LIPITOR) 10 MG tablet take 1 tablet by mouth once daily 03/05/17   [provider]  dexamethasone  (DECADRON) 4 MG tablet Take 1 tablet (4 mg total) by mouth daily. 10/04/21   Lloyd Huger, MD  dorzolamide-timolol (COSOPT) 22.3-6.8 MG/ML ophthalmic solution 1 drop 2 (two) times daily. 06/23/21   [provider]  lidocaine-prilocaine (EMLA) cream Apply to affected area once 10/04/21   Lloyd Huger, MD  mometasone-formoterol (DULERA) 200-5 MCG/ACT AERO Inhale 2 puffs into the lungs 2 (two) times daily. 09/02/21   Shelly Coss, MD  nicotine (NICODERM CQ - DOSED IN MG/24 HOURS) 21 mg/24hr patch Place 1 patch (21 mg total) onto the skin daily as needed (nicotine craving). Patient not taking: Reported on 10/19/2021 09/02/21   Shelly Coss, MD  ondansetron (ZOFRAN) 8 MG tablet Take 1 tablet (8 mg total) by mouth 2 (two) times daily as needed for refractory nausea / vomiting. 10/04/21   Lloyd Huger, MD  prochlorperazine (COMPAZINE) 10 MG tablet Take 1 tablet (10 mg total) by mouth every 6 (six) hours as needed (Nausea or vomiting). 10/04/21   Lloyd Huger, MD  tiotropium (SPIRIVA) 18 MCG inhalation capsule Place 1 capsule (18 mcg total) into inhaler and inhale daily. 09/03/21   Shelly Coss, MD  [place]uts INHUB 500-50 MCG/ACT AEPB  09/06/21   [provider]    Physical Exam: Vitals:   11/03/2021 1555 10/30/2021 1610 11/03/2021 1700 11/02/2021 1702  BP:  (!) 108/56  (!) 120/91  Pulse: (!) 34 (!) 30 (!) 39   Resp: (!) 24 (!) 23 (!) 24 (!) 25  Temp:      SpO2: (!) 84% (!) 84% 92% (!) 89%  Weight:      Height:       General: In severe respiratory distress.  Dry mucous membrane.  Thin body habitus. HEENT:       Eyes: PERRL, EOMI, no scleral icterus.       ENT: No discharge from the ears and nose, no pharynx injection, no tonsillar enlargement.        Neck: No JVD, no bruit, no mass felt. Heme: No neck lymph node enlargement. Cardiac: S1/S2, RRR, No murmurs, No gallops or rubs. Respiratory: Has coarse breathing sound bilaterally GI: Soft, nondistended, has  diffused tenderness with guarding, no rebound pain, no organomegaly, BS present. GU: No hematuria Ext: No pitting leg edema bilaterally. 1+DP/PT pulse bilaterally. Musculoskeletal: No joint deformities, No joint redness or warmth, no limitation of ROM in spin. Skin: No rashes.  Neuro: Alert, oriented X3, cranial nerves II-XII grossly intact, moves all extremities normally.  Psych: Patient is not psychotic, no suicidal or hemocidal ideation.  Labs on Admission: I have personally reviewed following labs and imaging studies  CBC: Recent Labs  Lab 11/19/2021 1504  WBC 12.6*  HGB 12.6*  HCT 39.9  MCV 85.3  PLT 932*   Basic Metabolic Panel: Recent Labs  Lab 11/12/2021 1504  NA 131*  K 6.6*  CL 94*  CO2 16*  GLUCOSE 39*  BUN 36*  CREATININE 2.08*  CALCIUM 9.0   GFR: Estimated Creatinine Clearance: 19.9 mL/min (A) (by C-G formula based on SCr of 2.08 mg/dL (H)). Liver  Function Tests: No results for input(s): AST, ALT, ALKPHOS, BILITOT, PROT, ALBUMIN in the last 168 hours. No results for input(s): LIPASE, AMYLASE in the last 168 hours. No results for input(s): AMMONIA in the last 168 hours. Coagulation Profile: No results for input(s): INR, PROTIME in the last 168 hours. Cardiac Enzymes: No results for input(s): CKTOTAL, CKMB, CKMBINDEX, TROPONINI in the last 168 hours. BNP (last 3 results) No results for input(s): PROBNP in the last 8760 hours. HbA1C: No results for input(s): HGBA1C in the last 72 hours. CBG: No results for input(s): GLUCAP in the last 168 hours. Lipid Profile: No results for input(s): CHOL, HDL, LDLCALC, TRIG, CHOLHDL, LDLDIRECT in the last 72 hours. Thyroid Function Tests: No results for input(s): TSH, T4TOTAL, FREET4, T3FREE, THYROIDAB in the last 72 hours. Anemia Panel: No results for input(s): VITAMINB12, FOLATE, FERRITIN, TIBC, IRON, RETICCTPCT in the last 72 hours. Urine analysis:    Component Value Date/Time   COLORURINE YELLOW 10/12/2021 1235    APPEARANCEUR CLEAR (A) 10/12/2021 1235   LABSPEC 1.025 10/12/2021 1235   PHURINE 6.0 10/12/2021 1235   GLUCOSEU NEGATIVE 10/12/2021 1235   HGBUR NEGATIVE 10/12/2021 [place] 10/12/2021 1235   KETONESUR NEGATIVE 10/12/2021 1235   PROTEINUR TRACE (A) 10/12/2021 1235   NITRITE NEGATIVE 10/12/2021 1235   LEUKOCYTESUR NEGATIVE 10/12/2021 1235   Sepsis Labs: @LABRCNTIP (procalcitonin:4,lacticidven:4) )No results found for this or any previous visit (from the past 240 hour(s)).   Radiological Exams on Admission: DG Chest Port 1 View  Result Date: 10/30/2021 CLINICAL DATA:  Epigastric abdominal pain and shortness of breath EXAM: PORTABLE CHEST 1 VIEW COMPARISON:  08/31/2021 FINDINGS: Hyperinflated left lung, with redemonstrated airspace disease throughout the right lung and right apical mass. Overall unchanged small right pleural effusion and pleural thickening. No new focal pulmonary opacity. Unchanged cardiac and mediastinal contours. No acute osseous abnormality. IMPRESSION: Overall unchanged small right pleural effusion and pleural thickening, with right lung airspace disease. Electronically Signed   By: Merilyn Baba M.D.   On: 11/19/2021 15:35     EKG:  Not done in ED, will get one.   Assessment/Plan Principal Problem:   Comfort measures only status Active Problems:   Hypertension   Hyperlipidemia   Carotid stenosis   Severe sepsis with septic shock (HCC)   Malignant Right pleural effusion / [place]   Protein-calorie malnutrition, severe   Acute on chronic respiratory failure with hypoxia (HCC)   Cigarette smoker   Adenocarcinoma of right lung (HCC)   Hyperkalemia   Hypoglycemia   Elevated troponin   HCAP (healthcare-associated pneumonia)   AKI (acute kidney injury) ([place])   Diffuse abdominal pain   Comfort measures only status: Patient has multiple chronic comorbidities, including end stage of adenocarcinoma of the right upper lobe lung with malignant  pleural effusion.  Patient presents with severe sepsis with septic shock possibly due to HCAP.  Patient has acute on chronic respiratory failure with hypoxia, oxygen desaturation to 52%.  His presentation is extremely complicated. His prognosis is extremely poor. EPD has had extensive discussion with pt's family. Patient's family members were very supportive. They agree for comfort care now. I also called pt's sister, who confirmed comfort care for pt, which is reasonable given his extremely poor prognosis.  Pt will be DNR.  -Will place in med-surg bed for comfort care. -Stop drawing labs and IVF -prn Lorazepam and Haldol for agitation - prn morphine for pain -Naphazoline 0.1 % ophthalmic solution -scopolamine patch -Psycho/Social: emotional support offered  to patient and family -consult to palliterative care team -Nurse may pronounce death  Other active issues are listed below, will stop drawing lab, will not continue home medications,  will focus on comfort care.   Hypertension   Hyperlipidemia   Carotid stenosis   Severe sepsis with septic shock (HCC)   Malignant Right pleural effusion / [place]   Protein-calorie malnutrition, severe   Acute on chronic respiratory failure with hypoxia (HCC)   Cigarette smoker   Adenocarcinoma of right lung (HCC)   Hyperkalemia   Hypoglycemia   Elevated troponin   HCAP (healthcare-associated pneumonia)   AKI (acute kidney injury) ([place])   Diffuse abdominal pain    DVT ppx: none Code Status: DNR Family Communication: Yes, patient's sister by phone Disposition Plan:  to be determined Consults called:  none Admission status and Level of care: Telemetry Medical:   as inpt       Status is: Inpatient  Remains inpatient appropriate           Date of Service 11/16/2021    [facility]   If 7PM-7AM, please contact night-coverage [email or web address] 11/17/2021, 5:49 PM

## 2021-10-31 NOTE — ED Notes (Addendum)
Multiple attempts blood draw. Unsuccessful. Port to be accessed in room

## 2021-10-31 NOTE — ED Provider Notes (Signed)
Emergency Medicine Provider Triage Evaluation Note  Cody Williams, a 81 y.o. male  was evaluated in triage.  Pt complains of epigastic abdominal pain and SOB.  Patient is a lung cancer patient currently on chemo, who wears 2 L of O2 chronically.  He presents today with what appears to be labored breathing, but denies any frank complaints.  O2 has been increased to 3 L per [place].  Review of Systems  Positive: SOB, epigastric pain Negative: FCS  Physical Exam  BP (!) 125/93    Pulse (!) 133    Temp 98.1 F (36.7 C)    Resp (!) 28    Ht 5\' 8"  (1.727 m)    Wt 48 kg    SpO2 98%    BMI 16.09 kg/m  Gen:   Awake, no distress  NAD Resp:  Increased effort Lungs clear MSK:   Moves extremities without difficulty  Other:  CVS: tachy rate  Medical Decision Making  Medically screening exam initiated at 2:32 PM.  Appropriate orders placed.  [place] Client was informed that the remainder of the evaluation will be completed by another provider, this initial triage assessment does not replace that evaluation, and the importance of remaining in the ED until their evaluation is complete.  Geriatric patient with ED evaluation of epigastric pain and increased work of breathing.  Family is present for interim history.   Melvenia Needles, PA-C 11/12/2021 1434    Vladimir Crofts, MD 11/26/2021 1534

## 2021-10-31 NOTE — ED Notes (Signed)
Pt placed on comfort care.

## 2021-10-31 NOTE — Telephone Encounter (Signed)
Patient [name] called to let Dr Grayland Ormond know that patient has been taken to ER.  She left message and did not say why and there is no note in system at this time

## 2021-10-31 NOTE — ED Triage Notes (Signed)
Pt to ED for mid abd pain and epigastric pain that started yesteday. Denies n/v/d Lung cancer pt, Chemo on 12/15, scheduled to go back on Thursday Wears 2 L [place] chronic.   Pt denies shob, however labored breathing noted.

## 2021-10-31 NOTE — Progress Notes (Signed)
Chaplain On-Call received referral from RN Alaina to support the patient's family, who have agreed to comfort care measures for the patient.  Chaplain met the patient's daughter; granddaughter; sister; brother-in-law at bedside.  Chaplain spoke to the patient, who was able to respond briefly. Patient stated his request for prayer. Chaplain provided spiritual and emotional support and prayer with the patient and his family.  Chaplain Pollyann Samples M.Div., [facility]

## 2021-10-31 NOTE — ED Notes (Signed)
Pt turned down to 2L [place] per EDP.

## 2021-10-31 NOTE — ED Provider Notes (Signed)
[facility] Provider Note    Event Date/Time   First MD Initiated Contact with Patient 11/27/2021 1448     (approximate)   History   Chest Pain and Abdominal Pain   HPI  Cody Williams is a 81 y.o. male   with a very complex past medical history with history of stage IV cancer and on review of past medical records From oncology clinic has stage IVa adenocarcinoma of the right lung status post pleurocentesis for malignant pleural effusion presents to the ER for evaluation of chest pain and respiratory distress.  Some of the history is obtained by patient's sister who is at bedside.  Patient wears 2 L nasal cannula arrives hypoxic dyspneic.  On conversation with the patient he states that he does not want to be put on a ventilator and wants to be allowed natural death.  Sister at bedside confirms those wishes.  Palliative care has been consulted but patient has not been established with hospice.  Report has been having chest pain or shortness of breath for more than 24 hours.  No report of any fevers.      Physical Exam   Triage Vital Signs: ED Triage Vitals  Enc Vitals Group     BP 11/22/2021 1422 (!) 125/93     Pulse Rate 11/28/2021 1426 (!) 133     Resp 11/22/2021 1425 (!) 28     Temp 11/15/2021 1425 98.1 F (36.7 C)     Temp src --      SpO2 11/17/2021 1425 98 %     Weight 11/09/2021 1422 105 lb 13.1 oz (48 kg)     Height 11/16/2021 1422 5\' 8"  (1.727 m)     Head Circumference --      Peak Flow --      Pain Score 11/18/2021 1422 6     Pain Loc --      Pain Edu? --      Excl. in [place]? --     Most recent vital signs: Vitals:   11/26/2021 1840 11/14/2021 2025  BP: (!) 87/53 (!) 85/63  Pulse: (!) 33 78  Resp: (!) 24 (!) 23  Temp:  98.7 F (37.1 C)  SpO2: 93% 91%    Constitutional: Alert and oriented.  Very frail cachectic and ill-appearing in respiratory distress Eyes: Conjunctivae are normal.  Head: Atraumatic. Nose: No congestion/rhinnorhea. Mouth/Throat:  Mucous membranes are dry.   Neck: Painless ROM.  Cardiovascular:   Good peripheral circulation. Respiratory: Tachypnea with use of accessory muscles with acute respiratory failure with hypoxia on supplemental oxygen.  Gastrointestinal: Soft and nontender.  Musculoskeletal: No lower extremity tenderness .  No joint effusions. Neurologic:  Normal speech and language. No gross focal neurologic deficits are appreciated.  Skin:  Skin is warm, dry and intact. No rash noted. Psychiatric: Mood and affect are normal. Speech and behavior are normal.   ED Results / Procedures / Treatments   Labs (all labs ordered are listed, but only abnormal results are displayed) Labs Reviewed  BASIC METABOLIC PANEL - Abnormal; Notable for the following components:      Result Value   Sodium 131 (*)    Potassium 6.6 (*)    Chloride 94 (*)    CO2 16 (*)    Glucose, Bld 39 (*)    BUN 36 (*)    Creatinine, Ser 2.08 (*)    GFR, Estimated 32 (*)    Anion gap 21 (*)    All  other components within normal limits  CBC - Abnormal; Notable for the following components:   WBC 12.6 (*)    Hemoglobin 12.6 (*)    RDW 20.2 (*)    Platelets 475 (*)    All other components within normal limits  LACTIC ACID, PLASMA - Abnormal; Notable for the following components:   Lactic Acid, Venous >9.0 (*)    All other components within normal limits  TROPONIN I (HIGH SENSITIVITY) - Abnormal; Notable for the following components:   Troponin I (High Sensitivity) 41 (*)    All other components within normal limits  RESP PANEL BY RT-PCR (FLU A&B, COVID) [id number]     EKG     RADIOLOGY I personally reviewed all radiographic images ordered to evaluate for the above acute complaints and reviewed radiology reports and findings.  These findings were personally discussed with the patient.  Please see medical record for radiology report.    PROCEDURES:  Critical Care performed:   Procedures   MEDICATIONS ORDERED IN  ED: Medications  morphine 2 MG/ML injection 1 mg (1 mg Intravenous Given 11/24/2021 2025)  haloperidol (HALDOL) tablet 0.5 mg (has no administration in time range)    Or  haloperidol (HALDOL) 2 MG/ML solution 0.5 mg (has no administration in time range)    Or  haloperidol lactate (HALDOL) injection 0.5 mg (has no administration in time range)  ipratropium-albuterol (DUONEB) 0.5-2.5 (3) MG/3ML nebulizer solution 3 mL (3 mLs Nebulization Given 11/10/2021 1515)  LORazepam (ATIVAN) tablet 1 mg ( Oral See Alternative 11/02/2021 2034)    Or  LORazepam (ATIVAN) 2 MG/ML concentrated solution 1 mg ( Sublingual See Alternative 11/14/2021 2034)    Or  LORazepam (ATIVAN) injection 1 mg (1 mg Intravenous Given 11/22/2021 2034)  scopolamine (TRANSDERM-SCOP) 1 MG/3DAYS 1.5 mg (1.5 mg Transdermal Patch Applied 11/20/2021 1740)  naphazoline-glycerin (CLEAR EYES REDNESS) ophth solution 1-2 drop (has no administration in time range)  ipratropium-albuterol (DUONEB) 0.5-2.5 (3) MG/3ML nebulizer solution 3 mL (3 mLs Nebulization Given 11/26/2021 1520)  sodium chloride 0.9 % bolus 500 mL (0 mLs Intravenous Stopped 11/21/2021 1622)  dextrose 50 % solution 50 mL (50 mLs Intravenous Given 11/22/2021 1623)     IMPRESSION / MDM / ASSESSMENT AND PLAN / ED COURSE  I reviewed the triage vital signs and the nursing notes.                              Differential diagnosis includes, but is not limited to, Asthma, copd, CHF, pna, ptx, malignancy, Pe, anemia  Patient with extensive past medical history including stage IV lung cancer with a DNR presents to the ER in respiratory distress.  He actually does not eyes any feelings of dyspnea but severely dyspneic use accessory muscles.  Does feel some improvement with supplemental oxygen given nebulizer treatment.  States that he does not want to be put on breathing machine does not want any escalation of care would like to be made comfortable and allowed to have a natural death.  This conversation was  witnessed by nurse, respiratory therapist as well as the patient's sister at bedside.  We will put on comfort measures orders.  Patient made DNR in system.  Will continue to monitor and treat symptoms.  Clinical Course as of 11/10/2021 2325  Tue Oct 31, 2021  1528 My review of chest x-ray I do not appreciate pneumothorax or significant change from previous.  Will await formal report from radiology.  On reassessment patient he feels his symptoms of dyspnea are improved after morphine. [PR]  1619 Noted renal failure with mild hyperkalemia as well as hyperglycemia will give food as well as D50.  Patient is denying any pain.  We discussed the blood work is presentation and reviewed his extensive disease history.  As he appears comfortable does not appear to be acutely decompensating at this time I do think he is appropriate for admission to the hospitalist service for comfort measures. [PR]    Clinical Course User Index [PR] Merlyn Lot, MD     FINAL CLINICAL IMPRESSION(S) / ED DIAGNOSES   Final diagnoses:  Acute respiratory failure with hypoxia ([place])  Hypoglycemia     Rx / DC Orders   ED Discharge Orders     None        Note:  This document was prepared using Dragon voice recognition software and may include unintentional dictation errors.    Merlyn Lot, MD 11/03/2021 2326

## 2021-11-01 ENCOUNTER — Encounter: Payer: Self-pay | Admitting: Internal Medicine

## 2021-11-01 ENCOUNTER — Encounter: Payer: Self-pay | Admitting: Oncology

## 2021-11-01 DIAGNOSIS — L8990 Pressure ulcer of unspecified site, unspecified stage: Secondary | ICD-10-CM | POA: Insufficient documentation

## 2021-11-02 ENCOUNTER — Encounter: Payer: Self-pay | Admitting: *Deleted

## 2021-11-02 ENCOUNTER — Inpatient Hospital Stay: Payer: Medicare HMO

## 2021-11-02 ENCOUNTER — Inpatient Hospital Stay: Payer: Medicare HMO | Admitting: Oncology

## 2021-11-02 ENCOUNTER — Encounter: Payer: Self-pay | Admitting: Oncology

## 2021-11-06 ENCOUNTER — Encounter: Payer: Self-pay | Admitting: Oncology

## 2021-11-06 ENCOUNTER — Inpatient Hospital Stay: Payer: Medicare HMO | Admitting: Hospice and Palliative Medicine

## 2021-11-29 NOTE — Progress Notes (Signed)
Patient ID: Cody Williams, male   DOB: Nov 01, 1940, 81 y.o.   MRN: [id number]  Notified the patient sister that the patient expired.  Dr Leslye Peer

## 2021-11-29 NOTE — Plan of Care (Signed)

## 2021-11-29 NOTE — Progress Notes (Signed)
Nutrition Brief Note  Chart reviewed. Pt now transitioning to comfort care.  No further nutrition interventions planned at this time.  Please re-consult as needed.   Summerlyn Fickel W, RD, LDN, CDCES Registered Dietitian II Certified Diabetes Care and Education Specialist Please refer to AMION for RD and/or RD on-call/weekend/after hours pager   

## 2021-11-29 NOTE — Death Summary Note (Signed)
DEATH SUMMARY   Patient Details  Name: Cody Williams MRN: [id number] DOB: 11-12-1940  Admission/Discharge Information   Admit Date:  11-Nov-2021  Date of Death: Date of Death: 11-12-2021  Time of Death: Time of Death: 0839  Length of Stay: 1  Referring Physician: Baxter Hire, MD   Reason(s) for Hospitalization  Shortness of breath, cough chest pain and abdominal pain.  Diagnoses  Preliminary cause of death:  Secondary Diagnoses (including complications and co-morbidities):  Principal Problem:   Comfort measures only status Active Problems:   Hypertension   Hyperlipidemia   Carotid stenosis   Severe sepsis with septic shock (HCC)   Malignant Right pleural effusion / [place]   Protein-calorie malnutrition, severe   Acute on chronic respiratory failure with hypoxia (HCC)   Cigarette smoker   Adenocarcinoma of right lung (HCC)   Hyperkalemia   Hypoglycemia   Elevated troponin   HCAP (healthcare-associated pneumonia)   AKI (acute kidney injury) ([place])   Diffuse abdominal pain   Pressure injury of skin   Brief Hospital Course (including significant findings, care, treatment, and services provided and events leading to death)  Cody Williams is a 81 y.o. year old male who is coming into the hospital with shortness of breath, cough, chest pain and abdominal pain.  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  He chronically wears 2 L of oxygen.  The patient was admitted to the hospital for comfort care measures.  The patient expired on 11-12-2020 at 8:39 AM.  The patient also had severe hyperkalemia at 6.6, acute kidney injury with a creatinine of 2.08, hypoglycemia with a sugar of 36, a lactic acid of greater than 9, metabolic acidosis with a CO2 of 16.    Pertinent Labs and Studies  Significant Diagnostic Studies DG Chest Port 1 View  Result Date: 2021/11/11 CLINICAL DATA:  Epigastric abdominal pain and  shortness of breath EXAM: PORTABLE CHEST 1 VIEW COMPARISON:  08/31/2021 FINDINGS: Hyperinflated left lung, with redemonstrated airspace disease throughout the right lung and right apical mass. Overall unchanged small right pleural effusion and pleural thickening. No new focal pulmonary opacity. Unchanged cardiac and mediastinal contours. No acute osseous abnormality. IMPRESSION: Overall unchanged small right pleural effusion and pleural thickening, with right lung airspace disease. Electronically Signed   By: Merilyn Baba M.D.   On: 11-Nov-2021 15:35   [place] CORE BIOPSY (LYMPH NODES)  Result Date: 10/10/2021 INDICATION: Lung cancer, right supraclavicular adenopathy EXAM: Ultrasound-guided core needle biopsy of right supraclavicular lymph node MEDICATIONS: None. ANESTHESIA/SEDATION: Local analgesia FLUOROSCOPY TIME:  N/a COMPLICATIONS: None immediate. PROCEDURE: Informed written consent was obtained from the patient after a thorough discussion of the procedural risks, benefits and alternatives. All questions were addressed. Maximal Sterile Barrier Technique was utilized including caps, mask, sterile gowns, sterile gloves, sterile drape, hand hygiene and skin antiseptic. A timeout was performed prior to the initiation of the procedure. The patient was placed supine on the exam table. Limited ultrasound of the right lower neck and supraclavicular region was performed, demonstrating abnormal soft tissue compatible with pathologic/conglomerate lymphadenopathy as described on recent PET-CT. Skin entry site was marked, and local analgesia was obtained with 1% lidocaine. Using ultrasound guidance, core needle biopsy was performed of the abnormal soft tissue in the right supraclavicular region using an 18 gauge core biopsy device x4 passes. Adequate tissue sampling was slightly challenging due to the heterogeneous nature of the tissue in question. All specimens were  submitted in formalin to pathology for further handling.  Limited postprocedure imaging demonstrated no hematoma or complicating feature. A clean dressing was placed after manual hemostasis. The patient tolerated the procedure well without immediate complication. IMPRESSION: Successful ultrasound-guided core needle biopsy of right supraclavicular lymphadenopathy. Electronically Signed   By: Albin Felling M.D.   On: 10/10/2021 15:57    Microbiology Recent Results (from the past 240 hour(s))  Resp Panel by RT-PCR (Flu A&B, Covid) Nasopharyngeal Swab     Status: None   Collection Time: 11/04/2021  5:04 PM   Specimen: Nasopharyngeal Swab; Nasopharyngeal(NP) swabs in vial transport medium  Result Value Ref Range Status   SARS Coronavirus 2 by RT PCR NEGATIVE NEGATIVE Final    Comment: (NOTE) SARS-CoV-2 target nucleic acids are NOT DETECTED.  The SARS-CoV-2 RNA is generally detectable in upper respiratory specimens during the acute phase of infection. The lowest concentration of SARS-CoV-2 viral copies this assay can detect is 138 copies/mL. A negative result does not preclude SARS-Cov-2 infection and should not be used as the sole basis for treatment or other patient management decisions. A negative result may occur with  improper specimen collection/handling, submission of specimen other than nasopharyngeal swab, presence of viral mutation(s) within the areas targeted by this assay, and inadequate number of viral copies(<138 copies/mL). A negative result must be combined with clinical observations, patient history, and epidemiological information. The expected result is Negative.  Fact Sheet for Patients:  EntrepreneurPulse.com.au  Fact Sheet for Healthcare Providers:  IncredibleEmployment.be  This test is no t yet approved or cleared by the [place] FDA and  has been authorized for detection and/or diagnosis of SARS-CoV-2 by FDA under an Emergency Use Authorization (EUA). This EUA will remain  in effect  (meaning this test can be used) for the duration of the COVID-19 declaration under Section 564(b)(1) of the Act, 21 U.S.C.section 360bbb-3(b)(1), unless the authorization is terminated  or revoked sooner.       Influenza A by PCR NEGATIVE NEGATIVE Final   Influenza B by PCR NEGATIVE NEGATIVE Final    Comment: (NOTE) The Xpert Xpress SARS-CoV-2/FLU/RSV plus assay is intended as an aid in the diagnosis of influenza from Nasopharyngeal swab specimens and should not be used as a sole basis for treatment. Nasal washings and aspirates are unacceptable for Xpert Xpress SARS-CoV-2/FLU/RSV testing.  Fact Sheet for Patients: EntrepreneurPulse.com.au  Fact Sheet for Healthcare Providers: IncredibleEmployment.be  This test is not yet approved or cleared by the [place] FDA and has been authorized for detection and/or diagnosis of SARS-CoV-2 by FDA under an Emergency Use Authorization (EUA). This EUA will remain in effect (meaning this test can be used) for the duration of the COVID-19 declaration under Section 564(b)(1) of the Act, 21 U.S.C. section 360bbb-3(b)(1), unless the authorization is terminated or revoked.  Performed at [facility], [address] [place], [place] [address]     Lab Basic Metabolic Panel: Recent Labs  Lab 11/23/2021 1504  NA 131*  K 6.6*  CL 94*  CO2 16*  GLUCOSE 39*  BUN 36*  CREATININE 2.08*  CALCIUM 9.0    CBC: Recent Labs  Lab 11/23/2021 1504  WBC 12.6*  HGB 12.6*  HCT 39.9  MCV 85.3  PLT 475*    Sepsis Labs: Recent Labs  Lab 11/18/2021 1504  WBC 12.6*  LATICACIDVEN >9.0*   I did not see the patient on this hospital stay.  The patient passed away before I saw him.  Neely Cecena [employer] Nov 08, 2021, 4:28  PM

## 2021-11-29 DEATH — deceased

## 2021-12-18 ENCOUNTER — Telehealth: Payer: Self-pay | Admitting: *Deleted

## 2021-12-18 NOTE — Telephone Encounter (Signed)
12/15/21- RN Received incoming [employer] form (Sr. [employer]/ [place]) from patient's sister, Doylene Bode. Sister is on dpr. Sister requires form to be completed.  Form completed on 12/18/21-pending Dr. Gary Fleet signature.   I will call sister when form is ready to be picked back up.

## 2021-12-18 NOTE — Telephone Encounter (Signed)
Sister picked up original copy of form at 1600

## 2021-12-18 NOTE — Telephone Encounter (Signed)
Copy of form sent to HIM to be scanned

## 2022-08-06 ENCOUNTER — Encounter (INDEPENDENT_AMBULATORY_CARE_PROVIDER_SITE_OTHER): Payer: Medicare HMO

## 2022-08-06 ENCOUNTER — Ambulatory Visit (INDEPENDENT_AMBULATORY_CARE_PROVIDER_SITE_OTHER): Payer: Medicare HMO | Admitting: Nurse Practitioner

## 2023-08-08 IMAGING — US US BIOPSY LYMPH NODE
1 series · 13 of 19 positions shown · non-contrast
Comparison: none

INDICATION: Lung cancer, right supraclavicular adenopathy

[Series 1: us biopsy lymph node · 0.05mm/px · 13 of 19 slices shown]
[im 1/19]
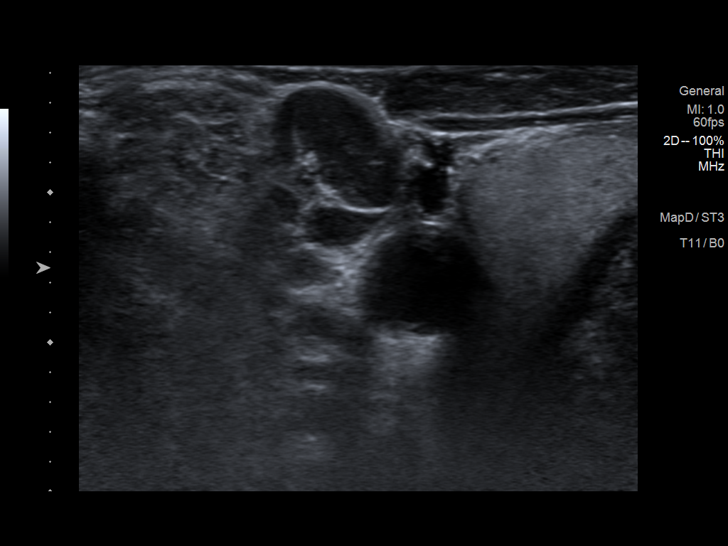
[im 3/19]
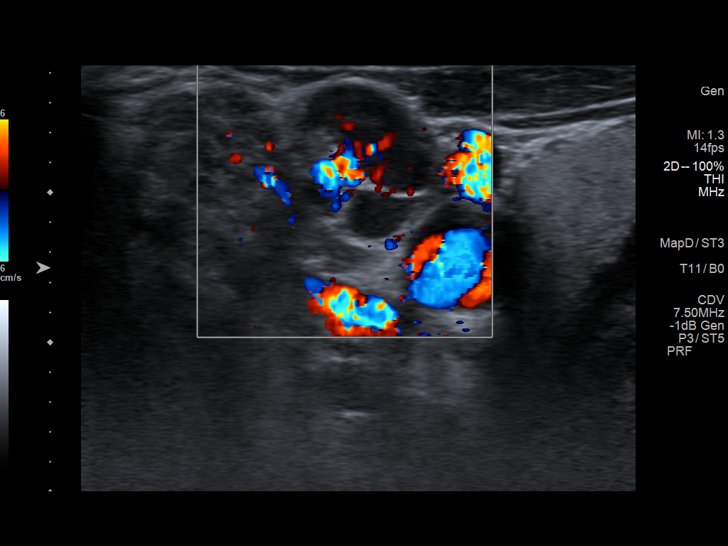
[im 4/19]
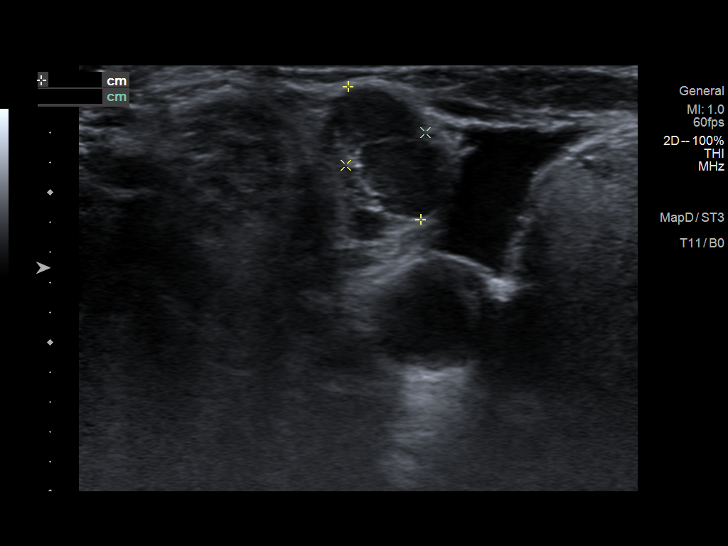
[im 6/19]
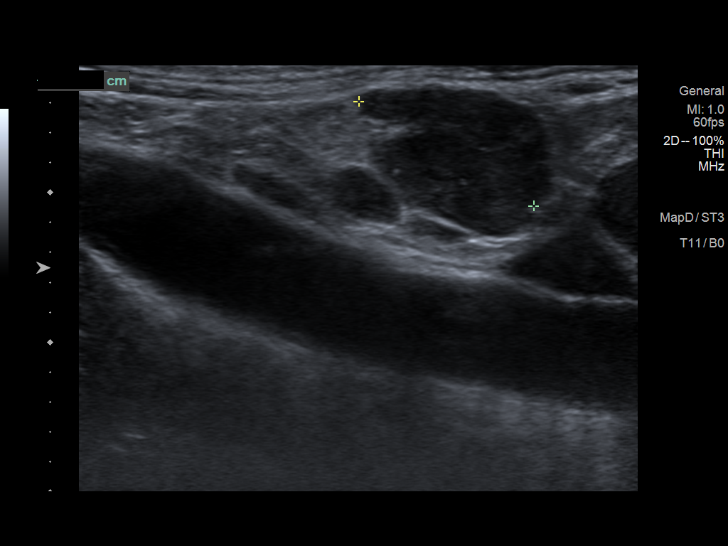
[im 7/19]
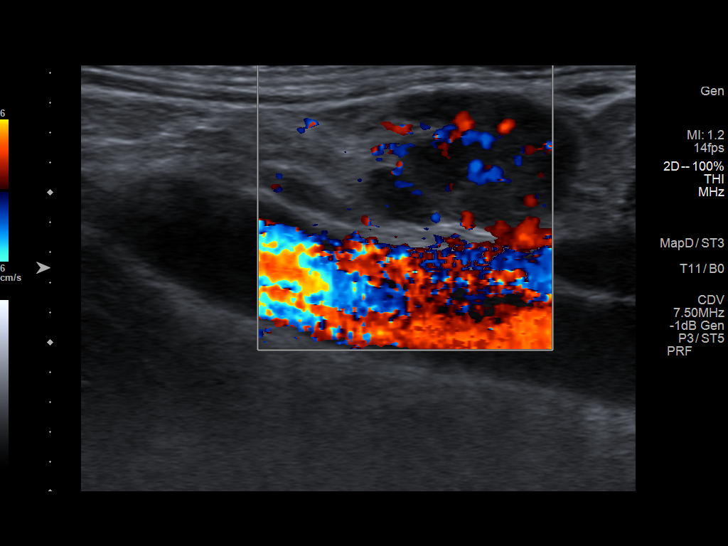
[im 9/19]
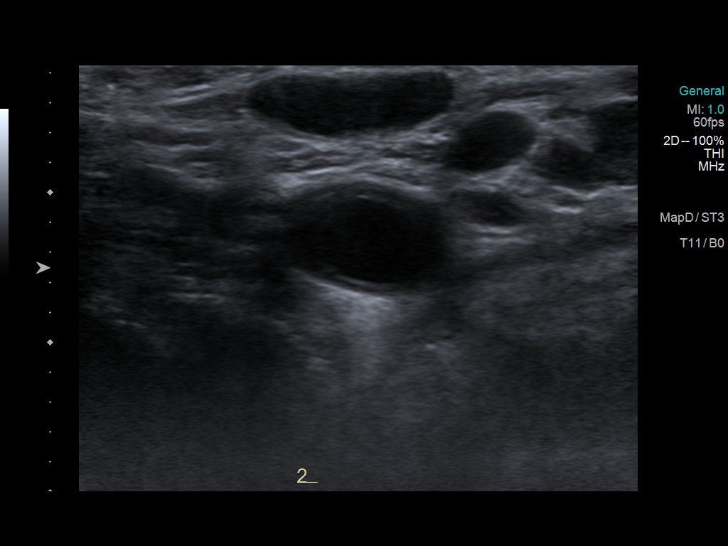
[im 10/19]
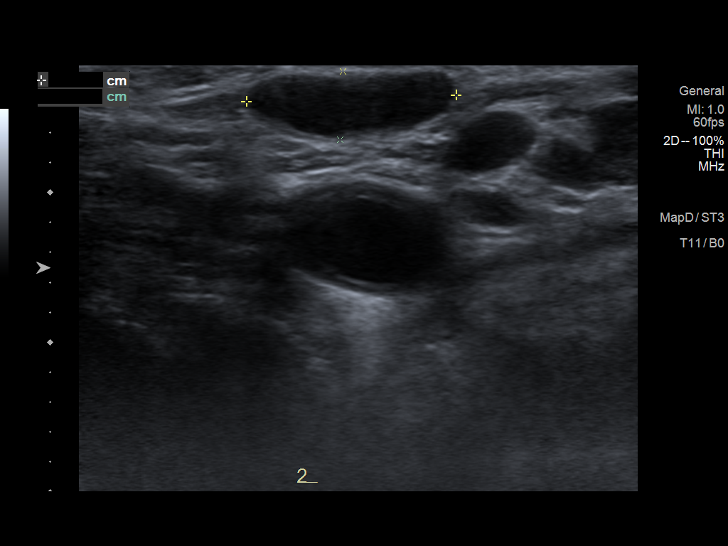
[im 11/19]
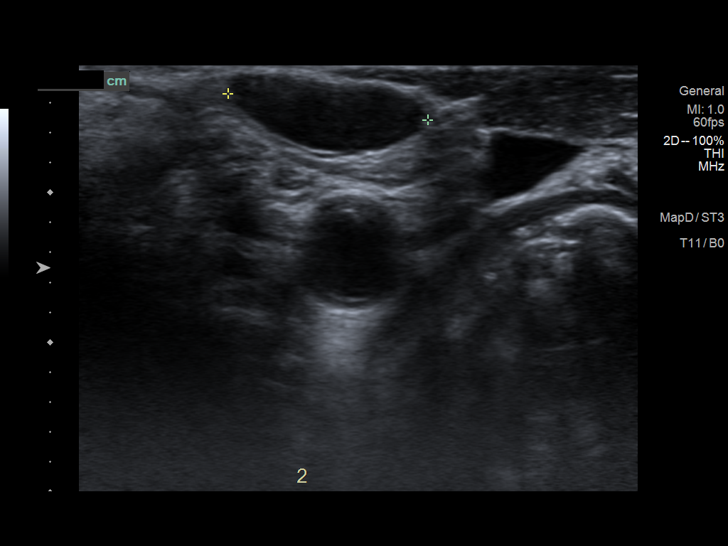
[im 13/19]
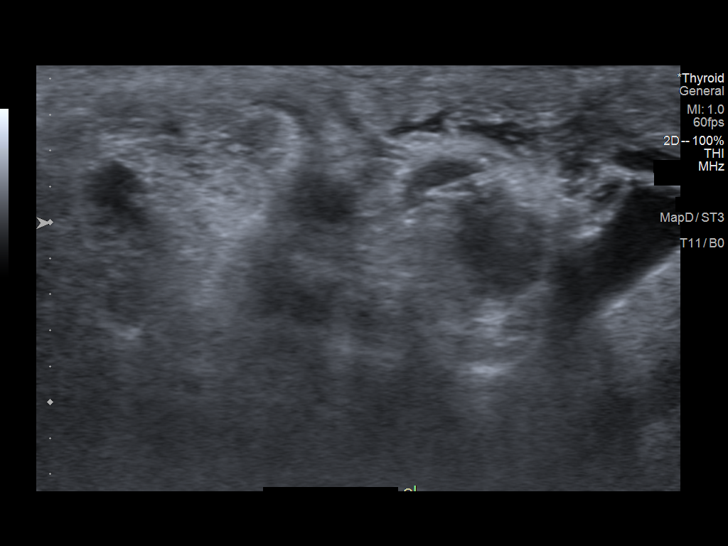
[im 14/19]
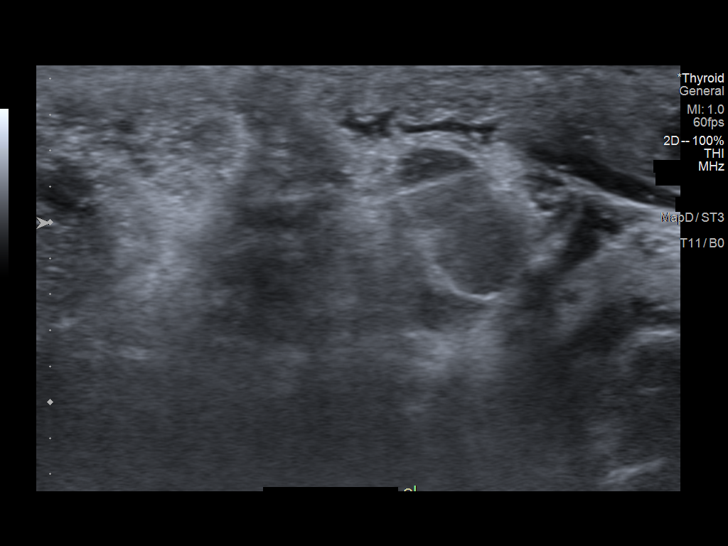
[im 16/19]
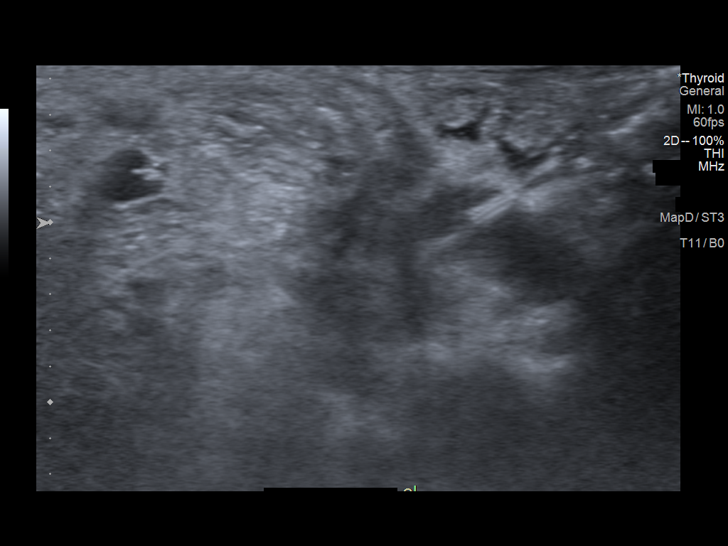
[im 17/19]
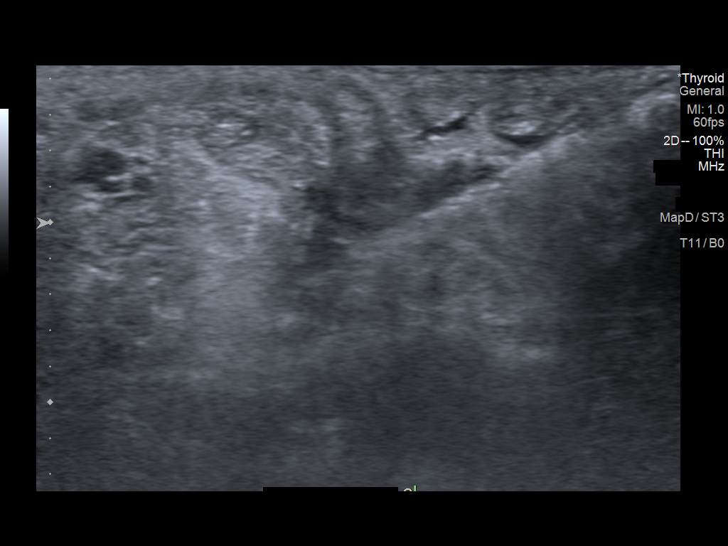
[im 19/19]
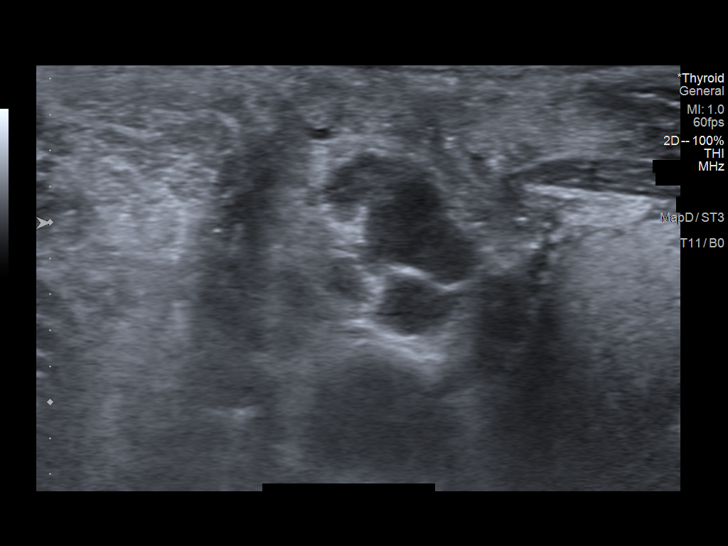

[13 of 19 positions shown; findings below may reference images not displayed]

EXAM:
Ultrasound-guided core needle biopsy of right supraclavicular lymph
node

MEDICATIONS:
None.

ANESTHESIA/SEDATION:
Local analgesia

FLUOROSCOPY TIME:  N/a

COMPLICATIONS:
None immediate.

PROCEDURE:
Informed written consent was obtained from the patient after a
thorough discussion of the procedural risks, benefits and
alternatives. All questions were addressed. Maximal Sterile Barrier
Technique was utilized including caps, mask, sterile gowns, sterile
gloves, sterile drape, hand hygiene and skin antiseptic. A timeout
was performed prior to the initiation of the procedure.

The patient was placed supine on the exam table. Limited ultrasound
of the right lower neck and supraclavicular region was performed,
demonstrating abnormal soft tissue compatible with
pathologic/conglomerate lymphadenopathy as described on recent
PET-CT. Skin entry site was marked, and local analgesia was obtained
with 1% lidocaine. Using ultrasound guidance, core needle biopsy was
performed of the abnormal soft tissue in the right supraclavicular
region using an 18 gauge core biopsy device x4 passes. Adequate
tissue sampling was slightly challenging due to the heterogeneous
nature of the tissue in question. All specimens were submitted in
formalin to pathology for further handling. Limited postprocedure
imaging demonstrated no hematoma or complicating feature. A clean
dressing was placed after manual hemostasis. The patient tolerated
the procedure well without immediate complication.
IMPRESSION: Successful ultrasound-guided core needle biopsy of right
supraclavicular lymphadenopathy.

## 2023-08-29 IMAGING — DX DG CHEST 1V PORT
1 series · 1 of 1 positions shown · non-contrast
Comparison: 08/31/2021

CLINICAL DATA: Epigastric abdominal pain and shortness of breath

EXAM:
PORTABLE CHEST 1 VIEW

[chest ap]
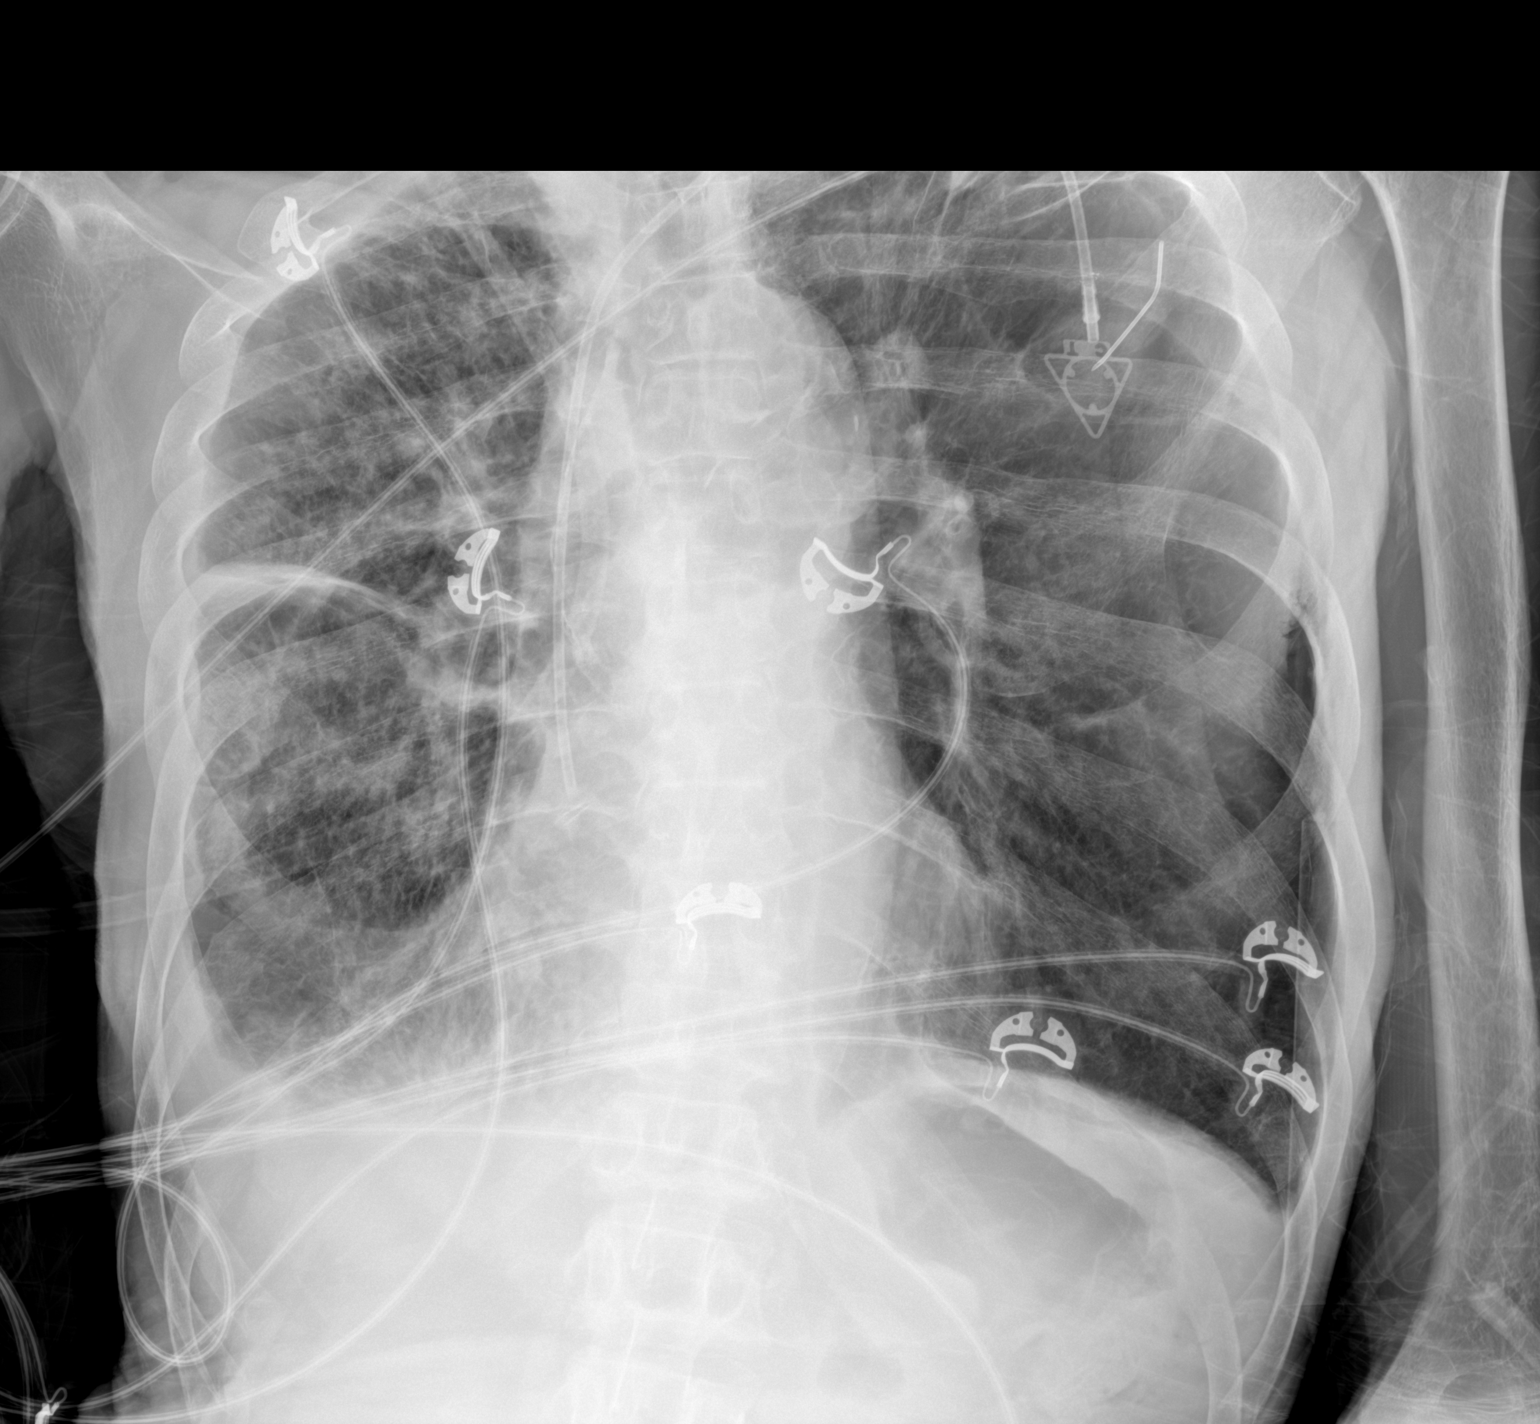

[1 of 1 positions shown; findings below may reference images not displayed]

FINDINGS: Hyperinflated left lung, with redemonstrated airspace disease
throughout the right lung and right apical mass. Overall unchanged
small right pleural effusion and pleural thickening. No new focal
pulmonary opacity.

Unchanged cardiac and mediastinal contours. No acute osseous
abnormality.
IMPRESSION: Overall unchanged small right pleural effusion and pleural
thickening, with right lung airspace disease.
